# Patient Record
Sex: Female | Born: 1970 | Race: White | Hispanic: No | Marital: Married | Smoking: Never smoker
Health system: Southern US, Community
[De-identification: ages and names within clinical notes are randomized; demographics above are authoritative.]

## PROBLEM LIST (undated history)

## (undated) DIAGNOSIS — K219 Gastro-esophageal reflux disease without esophagitis: Secondary | ICD-10-CM

## (undated) DIAGNOSIS — R519 Headache, unspecified: Secondary | ICD-10-CM

## (undated) DIAGNOSIS — N951 Menopausal and female climacteric states: Secondary | ICD-10-CM

## (undated) DIAGNOSIS — Z464 Encounter for fitting and adjustment of orthodontic device: Secondary | ICD-10-CM

## (undated) DIAGNOSIS — R002 Palpitations: Secondary | ICD-10-CM

## (undated) DIAGNOSIS — J45909 Unspecified asthma, uncomplicated: Secondary | ICD-10-CM

## (undated) DIAGNOSIS — IMO0001 Reserved for inherently not codable concepts without codable children: Secondary | ICD-10-CM

## (undated) DIAGNOSIS — Z973 Presence of spectacles and contact lenses: Secondary | ICD-10-CM

## (undated) HISTORY — PX: ABLATION: SHX5711

---

## 2004-02-21 ENCOUNTER — Ambulatory Visit: Payer: Self-pay | Admitting: Urology

## 2005-10-04 ENCOUNTER — Ambulatory Visit: Payer: Self-pay | Admitting: Internal Medicine

## 2005-11-14 ENCOUNTER — Ambulatory Visit: Payer: Self-pay

## 2008-01-31 ENCOUNTER — Ambulatory Visit: Payer: Self-pay | Admitting: Family Medicine

## 2010-08-09 ENCOUNTER — Ambulatory Visit: Payer: Self-pay | Admitting: Obstetrics and Gynecology

## 2010-11-23 ENCOUNTER — Ambulatory Visit: Payer: Self-pay | Admitting: Obstetrics and Gynecology

## 2012-02-27 ENCOUNTER — Ambulatory Visit: Payer: Self-pay | Admitting: Obstetrics & Gynecology

## 2013-05-06 ENCOUNTER — Ambulatory Visit: Payer: Self-pay | Admitting: Obstetrics & Gynecology

## 2013-05-27 ENCOUNTER — Ambulatory Visit: Payer: Self-pay | Admitting: Otolaryngology

## 2013-07-07 DIAGNOSIS — J329 Chronic sinusitis, unspecified: Secondary | ICD-10-CM | POA: Insufficient documentation

## 2013-07-07 DIAGNOSIS — M549 Dorsalgia, unspecified: Secondary | ICD-10-CM | POA: Insufficient documentation

## 2013-07-07 DIAGNOSIS — Z9109 Other allergy status, other than to drugs and biological substances: Secondary | ICD-10-CM | POA: Insufficient documentation

## 2013-07-07 DIAGNOSIS — Z8679 Personal history of other diseases of the circulatory system: Secondary | ICD-10-CM | POA: Insufficient documentation

## 2013-07-07 DIAGNOSIS — J45909 Unspecified asthma, uncomplicated: Secondary | ICD-10-CM | POA: Insufficient documentation

## 2013-07-07 DIAGNOSIS — R202 Paresthesia of skin: Secondary | ICD-10-CM | POA: Insufficient documentation

## 2013-07-07 DIAGNOSIS — R5383 Other fatigue: Secondary | ICD-10-CM | POA: Insufficient documentation

## 2014-06-07 ENCOUNTER — Ambulatory Visit: Payer: Self-pay | Admitting: Obstetrics & Gynecology

## 2015-11-14 ENCOUNTER — Other Ambulatory Visit: Payer: Self-pay | Admitting: Obstetrics & Gynecology

## 2015-11-14 DIAGNOSIS — Z1231 Encounter for screening mammogram for malignant neoplasm of breast: Secondary | ICD-10-CM

## 2015-11-25 ENCOUNTER — Ambulatory Visit
Admission: RE | Admit: 2015-11-25 | Discharge: 2015-11-25 | Disposition: A | Discharge status: Home / Self Care | Payer: BLUE CROSS/BLUE SHIELD | Source: Ambulatory Visit | Attending: Obstetrics & Gynecology | Admitting: Obstetrics & Gynecology

## 2015-11-25 ENCOUNTER — Other Ambulatory Visit: Payer: Self-pay | Admitting: Obstetrics & Gynecology

## 2015-11-25 DIAGNOSIS — Z1231 Encounter for screening mammogram for malignant neoplasm of breast: Secondary | ICD-10-CM

## 2016-08-03 ENCOUNTER — Other Ambulatory Visit: Payer: Self-pay | Admitting: Neurology

## 2016-08-03 DIAGNOSIS — H539 Unspecified visual disturbance: Secondary | ICD-10-CM

## 2016-08-03 DIAGNOSIS — R42 Dizziness and giddiness: Secondary | ICD-10-CM

## 2016-08-03 DIAGNOSIS — R413 Other amnesia: Secondary | ICD-10-CM

## 2016-08-17 ENCOUNTER — Ambulatory Visit
Admission: RE | Admit: 2016-08-17 | Discharge: 2016-08-17 | Disposition: A | Discharge status: Home / Self Care | Payer: BLUE CROSS/BLUE SHIELD | Source: Ambulatory Visit | Attending: Neurology | Admitting: Neurology

## 2016-08-17 DIAGNOSIS — R4182 Altered mental status, unspecified: Secondary | ICD-10-CM | POA: Diagnosis present

## 2016-08-17 DIAGNOSIS — H539 Unspecified visual disturbance: Secondary | ICD-10-CM

## 2016-08-17 DIAGNOSIS — G44099 Other trigeminal autonomic cephalgias (TAC), not intractable: Secondary | ICD-10-CM | POA: Insufficient documentation

## 2016-08-17 DIAGNOSIS — R413 Other amnesia: Secondary | ICD-10-CM | POA: Insufficient documentation

## 2016-08-17 DIAGNOSIS — R42 Dizziness and giddiness: Secondary | ICD-10-CM

## 2016-08-17 MED ORDER — GADOBENATE DIMEGLUMINE 529 MG/ML IV SOLN
12.0000 mL | Freq: Once | INTRAVENOUS | Status: AC | PRN
  Administered 2016-08-17: 12 mL via INTRAVENOUS

## 2016-09-25 DIAGNOSIS — J320 Chronic maxillary sinusitis: Secondary | ICD-10-CM | POA: Diagnosis not present

## 2016-09-25 DIAGNOSIS — J331 Polypoid sinus degeneration: Secondary | ICD-10-CM | POA: Diagnosis not present

## 2016-12-11 DIAGNOSIS — J320 Chronic maxillary sinusitis: Secondary | ICD-10-CM | POA: Diagnosis not present

## 2016-12-11 DIAGNOSIS — J331 Polypoid sinus degeneration: Secondary | ICD-10-CM | POA: Diagnosis not present

## 2017-01-21 DIAGNOSIS — R1084 Generalized abdominal pain: Secondary | ICD-10-CM | POA: Diagnosis not present

## 2017-01-21 DIAGNOSIS — R197 Diarrhea, unspecified: Secondary | ICD-10-CM | POA: Diagnosis not present

## 2017-01-22 ENCOUNTER — Other Ambulatory Visit: Payer: Self-pay | Admitting: Physician Assistant

## 2017-01-22 DIAGNOSIS — R1084 Generalized abdominal pain: Secondary | ICD-10-CM

## 2017-01-29 ENCOUNTER — Ambulatory Visit: Payer: BLUE CROSS/BLUE SHIELD

## 2017-06-28 ENCOUNTER — Ambulatory Visit (INDEPENDENT_AMBULATORY_CARE_PROVIDER_SITE_OTHER): Payer: BLUE CROSS/BLUE SHIELD | Admitting: Obstetrics & Gynecology

## 2017-06-28 ENCOUNTER — Encounter: Payer: Self-pay | Admitting: Obstetrics & Gynecology

## 2017-06-28 VITALS — BP 120/80 | HR 60 | Ht 65.0 in | Wt 145.0 lb

## 2017-06-28 DIAGNOSIS — Z1231 Encounter for screening mammogram for malignant neoplasm of breast: Secondary | ICD-10-CM

## 2017-06-28 DIAGNOSIS — Z0000 Encounter for general adult medical examination without abnormal findings: Secondary | ICD-10-CM

## 2017-06-28 DIAGNOSIS — Z13220 Encounter for screening for lipoid disorders: Secondary | ICD-10-CM

## 2017-06-28 DIAGNOSIS — Z1329 Encounter for screening for other suspected endocrine disorder: Secondary | ICD-10-CM | POA: Diagnosis not present

## 2017-06-28 DIAGNOSIS — Z131 Encounter for screening for diabetes mellitus: Secondary | ICD-10-CM

## 2017-06-28 DIAGNOSIS — Z1321 Encounter for screening for nutritional disorder: Secondary | ICD-10-CM | POA: Diagnosis not present

## 2017-06-28 DIAGNOSIS — N951 Menopausal and female climacteric states: Secondary | ICD-10-CM | POA: Insufficient documentation

## 2017-06-28 DIAGNOSIS — Z1239 Encounter for other screening for malignant neoplasm of breast: Secondary | ICD-10-CM

## 2017-06-28 DIAGNOSIS — Z124 Encounter for screening for malignant neoplasm of cervix: Secondary | ICD-10-CM

## 2017-06-28 MED ORDER — CLONIDINE 0.1 MG/24HR TD PTWK: 0.1000 mg | MEDICATED_PATCH | TRANSDERMAL | 11 refills | Status: DC

## 2017-06-28 NOTE — Progress Notes (Signed)
HPI:      [name] is a 47 y.o. [id number] who LMP was in the past, she presents today for her annual examination.  The patient has no complaints today other than worsening HOT FLASHES and NIGHT SWEATS over the past 1+ year. The patient is sexually active. Herlast pap: approximate date 2015 and was normal and last mammogram: approximate date 2017 and was normal.  The patient does perform self breast exams.  There is notable family history of breast or ovarian cancer in her family. The patient is not taking hormone replacement therapy. Patient denies post-menopausal vaginal bleeding.  Pt has had ablation. The patient has regular exercise: yes. The patient denies current symptoms of depression.    PMHx: History reviewed. No pertinent past medical history. Past Surgical History:  Procedure Laterality Date  . ABLATION     Family History  Problem Relation Age of Onset  . Lung cancer Mother   . Diabetes Mother   . Heart failure Mother   . CAD Mother   . Heart disease Father   . Breast cancer Maternal Aunt    Social History   Tobacco Use  . Smoking status: Never Smoker  . Smokeless tobacco: Never Used  Substance Use Topics  . Alcohol use: Yes  . Drug use: Not Currently    Current Outpatient Medications:  .  SUMAtriptan (IMITREX) 100 MG tablet, Take by mouth., Disp: , Rfl:  .  cloNIDine (CATAPRES - DOSED IN MG/24 HR) 0.1 mg/24hr patch, Place 1 patch (0.1 mg total) onto the skin once a week., Disp: 4 patch, Rfl: 11 Allergies: Patient has no known allergies.  Review of Systems  Constitutional: Negative for chills, fever and malaise/fatigue.  HENT: Negative for congestion, sinus pain and sore throat.   Eyes: Negative for blurred vision and pain.  Respiratory: Negative for cough and wheezing.   Cardiovascular: Negative for chest pain and leg swelling.  Gastrointestinal: Negative for abdominal pain, constipation, diarrhea, heartburn, nausea and vomiting.  Genitourinary: Negative  for dysuria, frequency, hematuria and urgency.  Musculoskeletal: Negative for back pain, joint pain, myalgias and neck pain.  Skin: Negative for itching and rash.  Neurological: Negative for dizziness, tremors and weakness.  Endo/Heme/Allergies: Does not bruise/bleed easily.  Psychiatric/Behavioral: Negative for depression. The patient is not nervous/anxious and does not have insomnia.     Objective: BP 120/80   Pulse 60   Ht 5\' 5"  (1.651 m)   Wt 145 lb (65.8 kg)   BMI 24.13 kg/m   Filed Weights   06/28/17 1538  Weight: 145 lb (65.8 kg)   Body mass index is 24.13 kg/m. Physical Exam  Constitutional: She is oriented to person, place, and time. She appears well-developed and well-nourished. No distress.  Genitourinary: Rectum normal, vagina normal and uterus normal. Pelvic exam was performed with patient supine. There is no rash or lesion on the right labia. There is no rash or lesion on the left labia. Vagina exhibits no lesion. No bleeding in the vagina. Right adnexum does not display mass and does not display tenderness. Left adnexum does not display mass and does not display tenderness. Cervix does not exhibit motion tenderness, lesion, friability or polyp.   Uterus is mobile and midaxial. Uterus is not enlarged or exhibiting a mass.  HENT:  Head: Normocephalic and atraumatic. Head is without laceration.  Right Ear: Hearing normal.  Left Ear: Hearing normal.  Nose: No epistaxis.  No foreign bodies.  Mouth/Throat: Uvula is midline, oropharynx is clear  and moist and mucous membranes are normal.  Eyes: Pupils are equal, round, and reactive to light.  Neck: Normal range of motion. Neck supple. No thyromegaly present.  Cardiovascular: Normal rate and regular rhythm. Exam reveals no gallop and no friction rub.  No murmur heard. Pulmonary/Chest: Effort normal and breath sounds normal. No respiratory distress. She has no wheezes. Right breast exhibits no mass, no skin change and no  tenderness. Left breast exhibits no mass, no skin change and no tenderness.  Abdominal: Soft. Bowel sounds are normal. She exhibits no distension. There is no tenderness. There is no rebound.  Musculoskeletal: Normal range of motion.  Neurological: She is alert and oriented to person, place, and time. No cranial nerve deficit.  Skin: Skin is warm and dry.  Psychiatric: She has a normal mood and affect. Judgment normal.  Vitals reviewed.   Assessment: Annual Exam 1. Annual physical exam   2. Screening for breast cancer   3. Encounter for vitamin deficiency screening   4. Screening for thyroid disorder   5. Screening for cervical cancer   6. Screening for diabetes mellitus   7. Screening for cholesterol level   8. Hot flash, menopausal     Plan:            1.  Cervical Screening-  Pap smear done today  2. Breast screening- Exam annually and mammogram scheduled  3. Colonoscopy every 10 years, Hemoccult testing after age 49  4. Labs To return fasting at a later date  5. Counseling for hormonal therapy: none Will start Clonidine patch for hot flash sx's and monitor progress Info provided.  Alternatives discussed.    F/U  Return in about 1 year (around 06/29/2018) for Annual.  Kirsten MajorPaul Jasher Barkan, MD, Merlinda FrederickFACOG [facility], [facility] 06/28/2017  4:08 PM

## 2017-06-28 NOTE — Patient Instructions (Addendum)
PAP every three years Mammogram every year    Call [phone] to schedule at [facility] Colonoscopy every 10 years after age 49 Labs soon  Clonidine skin patches What is this medicine? CLONIDINE (KLOE ni deen) is used to treat high blood pressure. This medicine may be used for other purposes; ask your health care provider or pharmacist if you have questions. COMMON BRAND NAME(S): Catapres-TTS What should I tell my health care provider before I take this medicine? They need to know if you have any of these conditions: -kidney disease -an unusual or allergic reaction to clonidine, other medicines, foods, dyes, or preservatives -pregnant or trying to get pregnant -breast-feeding How should I use this medicine? This medicine is for external use only. Follow the directions on the prescription label. Apply the patch to an area of the upper arm or part of the body that is clean, dry and hairless. Avoid injured, irritated, calloused, or scarred areas. Use a different site each time to prevent skin irritation. Do not cut or trim the patch. One patch should last for 7 days. Do not use your medicine more often than directed. Do not stop using except on the advice of your doctor or health care professional. You must gradually reduce the dose or you may get a dangerous increase in blood pressure. Talk to your pediatrician regarding the use of this medicine in children. Special care may be needed. Overdosage: If you think you have taken too much of this medicine contact a poison control center or emergency room at once. NOTE: This medicine is only for you. Do not share this medicine with others. What if I miss a dose? Replace each patch on the same day of each week, or if the patch falls off. If you do forget to change the patch for two or three days, check with your doctor or health care professional. What may interact with this medicine? Do not take this medicine with any of the following  medications: -MAOIs like Carbex, Eldepryl, Marplan, Nardil, and Parnate This medicine may also interact with the following medications: -barbiturate medicines for inducing sleep or treating seizures like phenobarbital -certain medicines for blood pressure, heart disease, irregular heart beat -certain medicines for depression, anxiety, or psychotic disturbances -prescription pain medicines This list may not describe all possible interactions. Give your health care provider a list of all the medicines, herbs, non-prescription drugs, or dietary supplements you use. Also tell them if you smoke, drink alcohol, or use illegal drugs. Some items may interact with your medicine. What should I watch for while using this medicine? Visit your doctor or health care professional for regular checks on your progress. Check your heart rate and blood pressure regularly while you are using this medicine. Ask your doctor or health care professional what your heart rate should be and when you should contact him or her. You can shower or bathe with the skin patch in position. If the patch gets loose, cover it with the extra adhesive overlay provided. You may get drowsy or dizzy. Do not drive, use machinery, or do anything that needs mental alertness until you know how this medicine affects you. To avoid dizzy or fainting spells, do not stand or sit up quickly, especially if you are an older person. Alcohol can make you more drowsy and dizzy. Avoid alcoholic drinks. Your mouth may get dry. Chewing sugarless gum or sucking hard candy, and drinking plenty of water will help. Do not treat yourself for coughs, colds, or pain while  you are using this medicine without asking your doctor or health care professional for advice. Some ingredients may increase your blood pressure. If you are going to have surgery tell your doctor or health care professional that you are using this medicine. If you are going to have a magnetic  resonance imaging (MRI) procedure, tell your MRI technician if you have this patch on your body. It must be removed before a MRI. What side effects may I notice from receiving this medicine? Side effects that you should report to your doctor or health care professional as soon as possible: -allergic reactions like skin rash, itching or hives, swelling of the face, lips, or tongue -anxiety, nervousness -chest pain -depression -fast, irregular heartbeat -swelling of feet or legs -unusually weak or tired Side effects that usually do not require medical attention (report to your doctor or health care professional if they continue or are bothersome): -change in sex drive or performance -constipation -headache -skin redness, irritation, or darkening under the patch area This list may not describe all possible side effects. Call your doctor for medical advice about side effects. You may report side effects to FDA at 1-800-FDA-1088. Where should I keep my medicine? Keep out of the reach of children. Store at room temperature between 15 and 30 degrees C (59 to 86 degrees F). Throw away any unused medicine after the expiration date. NOTE: This sheet is a summary. It may not cover all possible information. If you have questions about this medicine, talk to your doctor, pharmacist, or health care provider.  2018 Elsevier/Gold Standard (2014-11-22 16:10:[id number]:28)

## 2017-07-03 LAB — IGP, APTIMA HPV
HPV APTIMA: NEGATIVE
PAP SMEAR COMMENT: 0

## 2017-07-09 ENCOUNTER — Other Ambulatory Visit: Payer: BLUE CROSS/BLUE SHIELD

## 2017-07-09 DIAGNOSIS — Z1321 Encounter for screening for nutritional disorder: Secondary | ICD-10-CM

## 2017-07-09 DIAGNOSIS — Z13220 Encounter for screening for lipoid disorders: Secondary | ICD-10-CM

## 2017-07-09 DIAGNOSIS — Z131 Encounter for screening for diabetes mellitus: Secondary | ICD-10-CM

## 2017-07-09 DIAGNOSIS — Z1329 Encounter for screening for other suspected endocrine disorder: Secondary | ICD-10-CM

## 2017-07-10 LAB — LIPID PANEL
CHOLESTEROL TOTAL: 151 mg/dL (ref 100–199)
Chol/HDL Ratio: 3.2 ratio (ref 0.0–4.4)
HDL: 47 mg/dL (ref >39)
LDL CALC: 80 mg/dL (ref 0–99)
Triglycerides: 119 mg/dL (ref 0–149)
VLDL Cholesterol Cal: 24 mg/dL (ref 5–40)

## 2017-07-10 LAB — GLUCOSE, FASTING: Glucose, Plasma: 54 mg/dL — ABNORMAL LOW (ref 65–99)

## 2017-07-10 LAB — TSH: TSH: 2.96 u[IU]/mL (ref 0.450–4.500)

## 2017-07-10 LAB — VITAMIN D 25 HYDROXY (VIT D DEFICIENCY, FRACTURES): Vit D, 25-Hydroxy: 32.7 ng/mL (ref 30.0–100.0)

## 2017-07-25 ENCOUNTER — Ambulatory Visit
Admission: RE | Admit: 2017-07-25 | Discharge: 2017-07-25 | Disposition: A | Discharge status: Home / Self Care | Payer: BLUE CROSS/BLUE SHIELD | Source: Ambulatory Visit | Attending: Obstetrics & Gynecology | Admitting: Obstetrics & Gynecology

## 2017-07-25 DIAGNOSIS — Z1239 Encounter for other screening for malignant neoplasm of breast: Secondary | ICD-10-CM

## 2017-07-25 DIAGNOSIS — Z1231 Encounter for screening mammogram for malignant neoplasm of breast: Secondary | ICD-10-CM | POA: Diagnosis present

## 2017-09-11 DIAGNOSIS — R6882 Decreased libido: Secondary | ICD-10-CM | POA: Diagnosis not present

## 2017-09-11 DIAGNOSIS — N951 Menopausal and female climacteric states: Secondary | ICD-10-CM | POA: Diagnosis not present

## 2017-09-11 DIAGNOSIS — R232 Flushing: Secondary | ICD-10-CM | POA: Diagnosis not present

## 2017-09-19 DIAGNOSIS — G479 Sleep disorder, unspecified: Secondary | ICD-10-CM | POA: Diagnosis not present

## 2017-09-19 DIAGNOSIS — N951 Menopausal and female climacteric states: Secondary | ICD-10-CM | POA: Diagnosis not present

## 2017-09-19 DIAGNOSIS — R232 Flushing: Secondary | ICD-10-CM | POA: Diagnosis not present

## 2017-12-13 DIAGNOSIS — R51 Headache: Secondary | ICD-10-CM | POA: Diagnosis not present

## 2017-12-13 DIAGNOSIS — J4520 Mild intermittent asthma, uncomplicated: Secondary | ICD-10-CM | POA: Diagnosis not present

## 2017-12-13 DIAGNOSIS — Z79899 Other long term (current) drug therapy: Secondary | ICD-10-CM | POA: Diagnosis not present

## 2017-12-13 DIAGNOSIS — M2550 Pain in unspecified joint: Secondary | ICD-10-CM | POA: Diagnosis not present

## 2018-02-24 DIAGNOSIS — L820 Inflamed seborrheic keratosis: Secondary | ICD-10-CM | POA: Diagnosis not present

## 2018-02-24 DIAGNOSIS — L989 Disorder of the skin and subcutaneous tissue, unspecified: Secondary | ICD-10-CM | POA: Diagnosis not present

## 2018-02-24 DIAGNOSIS — L859 Epidermal thickening, unspecified: Secondary | ICD-10-CM | POA: Diagnosis not present

## 2018-02-24 DIAGNOSIS — L578 Other skin changes due to chronic exposure to nonionizing radiation: Secondary | ICD-10-CM | POA: Diagnosis not present

## 2018-07-14 DIAGNOSIS — J320 Chronic maxillary sinusitis: Secondary | ICD-10-CM | POA: Diagnosis not present

## 2018-10-17 ENCOUNTER — Other Ambulatory Visit: Payer: Self-pay | Admitting: Internal Medicine

## 2018-10-17 DIAGNOSIS — Z1231 Encounter for screening mammogram for malignant neoplasm of breast: Secondary | ICD-10-CM

## 2018-12-10 ENCOUNTER — Ambulatory Visit
Admission: RE | Admit: 2018-12-10 | Discharge: 2018-12-10 | Disposition: A | Discharge status: Home / Self Care | Payer: BLUE CROSS/BLUE SHIELD | Source: Ambulatory Visit | Attending: Internal Medicine | Admitting: Internal Medicine

## 2018-12-10 ENCOUNTER — Other Ambulatory Visit: Payer: Self-pay

## 2018-12-10 DIAGNOSIS — Z1231 Encounter for screening mammogram for malignant neoplasm of breast: Secondary | ICD-10-CM | POA: Insufficient documentation

## 2019-10-06 DIAGNOSIS — J320 Chronic maxillary sinusitis: Secondary | ICD-10-CM | POA: Insufficient documentation

## 2019-11-13 HISTORY — PX: SINUS SURGERY WITH INSTATRAK: SHX5215

## 2019-11-24 ENCOUNTER — Other Ambulatory Visit: Payer: Self-pay | Admitting: Internal Medicine

## 2019-11-24 DIAGNOSIS — Z1231 Encounter for screening mammogram for malignant neoplasm of breast: Secondary | ICD-10-CM

## 2019-12-02 ENCOUNTER — Other Ambulatory Visit: Payer: Self-pay

## 2019-12-02 ENCOUNTER — Telehealth (INDEPENDENT_AMBULATORY_CARE_PROVIDER_SITE_OTHER): Payer: Self-pay | Admitting: Gastroenterology

## 2019-12-02 DIAGNOSIS — Z1211 Encounter for screening for malignant neoplasm of colon: Secondary | ICD-10-CM

## 2019-12-02 NOTE — Progress Notes (Signed)
Gastroenterology Pre-Procedure Review  Request Date: Friday 01/29/20 Requesting Physician: Dr. Servando Snare  PATIENT REVIEW QUESTIONS: The patient responded to the following health history questions as indicated:    1. Are you having any GI issues? no 2. Do you have a personal history of Polyps? no 3. Do you have a family history of Colon Cancer or Polyps? no 4. Diabetes Mellitus? no 5. Joint replacements in the past 12 months?no 6. Major health problems in the past 3 months?sinus surgery  3 wks ago at [facility] with Dr. Cindie Laroche 7. Any artificial heart valves, MVP, or defibrillator?no    MEDICATIONS & ALLERGIES:    Patient reports the following regarding taking any anticoagulation/antiplatelet therapy:   Plavix, Coumadin, Eliquis, Xarelto, Lovenox, Pradaxa, Brilinta, or Effient? no Aspirin? no  Patient confirms/reports the following medications:  Current Outpatient Medications  Medication Sig Dispense Refill  . traZODone (DESYREL) 25 mg TABS tablet Take 25 mg by mouth at bedtime.     No current facility-administered medications for this visit.    Patient confirms/reports the following allergies:  No Known Allergies  Orders Placed This Encounter  Procedures  . Procedural/ Surgical Case Request: COLONOSCOPY WITH PROPOFOL    Standing Status:   Standing    Number of Occurrences:   1    Order Specific Question:   Pre-op diagnosis    Answer:   screening colonoscopy    Order Specific Question:   CPT Code    Answer:   04540    AUTHORIZATION INFORMATION Primary Insurance: 1D#: Group #:  Secondary Insurance: 1D#: Group #:  SCHEDULE INFORMATION: Date: 01/29/20 Time: Location:[facility]

## 2020-01-25 ENCOUNTER — Other Ambulatory Visit: Payer: Self-pay

## 2020-01-25 ENCOUNTER — Encounter: Payer: Self-pay | Admitting: Gastroenterology

## 2020-01-27 ENCOUNTER — Other Ambulatory Visit: Payer: Self-pay

## 2020-01-27 ENCOUNTER — Other Ambulatory Visit
Admission: RE | Admit: 2020-01-27 | Discharge: 2020-01-27 | Disposition: A | Discharge status: Home / Self Care | Payer: BLUE CROSS/BLUE SHIELD | Source: Ambulatory Visit | Attending: Gastroenterology | Admitting: Gastroenterology

## 2020-01-27 DIAGNOSIS — Z01812 Encounter for preprocedural laboratory examination: Secondary | ICD-10-CM | POA: Insufficient documentation

## 2020-01-27 DIAGNOSIS — Z20822 Contact with and (suspected) exposure to covid-19: Secondary | ICD-10-CM | POA: Insufficient documentation

## 2020-01-27 LAB — SARS CORONAVIRUS 2 (TAT 6-24 HRS): SARS Coronavirus 2: NEGATIVE

## 2020-01-28 NOTE — Discharge Instructions (Signed)
General Anesthesia, Adult, Care After This sheet gives you information about how to care for yourself after your procedure. Your health care provider may also give you more specific instructions. If you have problems or questions, contact your health care provider. What can I expect after the procedure? After the procedure, the following side effects are common:  Pain or discomfort at the IV site.  Nausea.  Vomiting.  Sore throat.  Trouble concentrating.  Feeling cold or chills.  Weak or tired.  Sleepiness and fatigue.  Soreness and body aches. These side effects can affect parts of the body that were not involved in surgery. Follow these instructions at home:  For at least 24 hours after the procedure:  Have a responsible adult stay with you. It is important to have someone help care for you until you are awake and alert.  Rest as needed.  Do not: ? Participate in activities in which you could fall or become injured. ? Drive. ? Use heavy machinery. ? Drink alcohol. ? Take sleeping pills or medicines that cause drowsiness. ? Make important decisions or sign legal documents. ? Take care of children on your own. Eating and drinking  Follow any instructions from your health care provider about eating or drinking restrictions.  When you feel hungry, start by eating small amounts of foods that are soft and easy to digest (bland), such as toast. Gradually return to your regular diet.  Drink enough fluid to keep your urine pale yellow.  If you vomit, rehydrate by drinking water, juice, or clear broth. General instructions  If you have sleep apnea, surgery and certain medicines can increase your risk for breathing problems. Follow instructions from your health care provider about wearing your sleep device: ? Anytime you are sleeping, including during daytime naps. ? While taking prescription pain medicines, sleeping medicines, or medicines that make you drowsy.  Return to  your normal activities as told by your health care provider. Ask your health care provider what activities are safe for you.  Take over-the-counter and prescription medicines only as told by your health care provider.  If you smoke, do not smoke without supervision.  Keep all follow-up visits as told by your health care provider. This is important. Contact a health care provider if:  You have nausea or vomiting that does not get better with medicine.  You cannot eat or drink without vomiting.  You have pain that does not get better with medicine.  You are unable to pass urine.  You develop a skin rash.  You have a fever.  You have redness around your IV site that gets worse. Get help right away if:  You have difficulty breathing.  You have chest pain.  You have blood in your urine or stool, or you vomit blood. Summary  After the procedure, it is common to have a sore throat or nausea. It is also common to feel tired.  Have a responsible adult stay with you for the first 24 hours after general anesthesia. It is important to have someone help care for you until you are awake and alert.  When you feel hungry, start by eating small amounts of foods that are soft and easy to digest (bland), such as toast. Gradually return to your regular diet.  Drink enough fluid to keep your urine pale yellow.  Return to your normal activities as told by your health care provider. Ask your health care provider what activities are safe for you. This information is not   intended to replace advice given to you by your health care provider. Make sure you discuss any questions you have with your health care provider. Document Revised: 03/15/2017 Document Reviewed: 10/26/2016 Elsevier Patient Education  2020 Elsevier Inc.  

## 2020-01-29 ENCOUNTER — Ambulatory Visit: Payer: BLUE CROSS/BLUE SHIELD | Admitting: Anesthesiology

## 2020-01-29 ENCOUNTER — Other Ambulatory Visit: Payer: Self-pay

## 2020-01-29 ENCOUNTER — Ambulatory Visit
Admission: RE | Admit: 2020-01-29 | Discharge: 2020-01-29 | Disposition: A | Discharge status: Home / Self Care | Payer: BLUE CROSS/BLUE SHIELD | Attending: Gastroenterology | Admitting: Gastroenterology

## 2020-01-29 ENCOUNTER — Encounter: Payer: Self-pay | Admitting: Gastroenterology

## 2020-01-29 ENCOUNTER — Encounter
Admission: RE | Disposition: A | Discharge status: Home / Self Care | Payer: Self-pay | Source: Home / Self Care | Attending: Gastroenterology

## 2020-01-29 DIAGNOSIS — K5730 Diverticulosis of large intestine without perforation or abscess without bleeding: Secondary | ICD-10-CM | POA: Diagnosis not present

## 2020-01-29 DIAGNOSIS — Z1210 Encounter for screening for malignant neoplasm of intestinal tract, unspecified: Secondary | ICD-10-CM | POA: Diagnosis not present

## 2020-01-29 DIAGNOSIS — K640 First degree hemorrhoids: Secondary | ICD-10-CM | POA: Diagnosis not present

## 2020-01-29 DIAGNOSIS — Z1211 Encounter for screening for malignant neoplasm of colon: Secondary | ICD-10-CM | POA: Insufficient documentation

## 2020-01-29 HISTORY — PX: COLONOSCOPY WITH PROPOFOL: SHX5780

## 2020-01-29 HISTORY — DX: Menopausal and female climacteric states: N95.1

## 2020-01-29 HISTORY — DX: Headache, unspecified: R51.9

## 2020-01-29 HISTORY — DX: Gastro-esophageal reflux disease without esophagitis: K21.9

## 2020-01-29 HISTORY — DX: Unspecified asthma, uncomplicated: J45.909

## 2020-01-29 SURGERY — COLONOSCOPY WITH PROPOFOL
Anesthesia: General

## 2020-01-29 MED ORDER — PROPOFOL 10 MG/ML IV BOLUS
INTRAVENOUS | Status: DC | PRN
  Administered 2020-01-29: 100 mg via INTRAVENOUS
  Administered 2020-01-29: 50 mg via INTRAVENOUS

## 2020-01-29 MED ORDER — LACTATED RINGERS IV SOLN: INTRAVENOUS | Status: DC

## 2020-01-29 MED ORDER — LIDOCAINE HCL (CARDIAC) PF 100 MG/5ML IV SOSY
PREFILLED_SYRINGE | INTRAVENOUS | Status: DC | PRN
  Administered 2020-01-29: 50 mg via INTRAVENOUS

## 2020-01-29 MED ORDER — ACETAMINOPHEN 325 MG PO TABS: 325.0000 mg | ORAL_TABLET | ORAL | Status: DC | PRN

## 2020-01-29 MED ORDER — STERILE WATER FOR IRRIGATION IR SOLN
Status: DC | PRN
  Administered 2020-01-29: 150 mL

## 2020-01-29 MED ORDER — ACETAMINOPHEN 160 MG/5ML PO SOLN: 325.0000 mg | ORAL | Status: DC | PRN

## 2020-01-29 SURGICAL SUPPLY — 16 items
FCP ESCP3.2XJMB 240X2.8X (MISCELLANEOUS)
FORCEPS BIOP RAD 4 LRG CAP 4 (CUTTING FORCEPS) IMPLANT
FORCEPS BIOP RJ4 240 W/NDL (MISCELLANEOUS)
FORCEPS ESCP3.2XJMB 240X2.8X (MISCELLANEOUS) IMPLANT
GOWN CVR UNV OPN BCK APRN NK (MISCELLANEOUS) ×2 IMPLANT
GOWN ISOL THUMB LOOP REG UNIV (MISCELLANEOUS) ×6
INJECTOR VARIJECT VIN23 (MISCELLANEOUS) IMPLANT
KIT DEFENDO VALVE AND CONN (KITS) IMPLANT
KIT PRC NS LF DISP ENDO (KITS) ×1 IMPLANT
KIT PROCEDURE OLYMPUS (KITS) ×3
MANIFOLD NEPTUNE II (INSTRUMENTS) ×3 IMPLANT
MARKER SPOT ENDO TATTOO 5ML (MISCELLANEOUS) IMPLANT
PROBE APC STR FIRE (PROBE) IMPLANT
SPOT EX ENDOSCOPIC TATTOO (MISCELLANEOUS)
VARIJECT INJECTOR VIN23 (MISCELLANEOUS)
WATER STERILE IRR 250ML POUR (IV SOLUTION) ×3 IMPLANT

## 2020-01-29 NOTE — H&P (Signed)
Midge Minium, MD [facility] [address] [place], [place] [address] Phone: [phone] Fax : [phone]  Primary Care Physician:  Marguarite Arbour, MD Primary Gastroenterologist:  Dr. Servando Snare  Pre-Procedure History & Physical: HPI:  Kirsten Patterson is a 49 y.o. female is here for a screening colonoscopy.   Past Medical History:  Diagnosis Date  . Asthma    no flare up in years   . GERD (gastroesophageal reflux disease)   . Headache    migraines/ every few weeks  . Hot flashes due to menopause     Past Surgical History:  Procedure Laterality Date  . ABLATION    . SINUS SURGERY WITH INSTATRAK  11/13/2019   x 6    Prior to Admission medications   Medication Sig Start Date End Date Taking? Authorizing Provider  fluticasone (FLONASE) 50 MCG/ACT nasal spray Place into both nostrils daily.   Yes [provider]  traZODone (DESYREL) 25 mg TABS tablet Take 25 mg by mouth at bedtime as needed.     [provider]    Allergies as of 12/02/2019  . (No Known Allergies)    Family History  Problem Relation Age of Onset  . Lung cancer Mother   . Diabetes Mother   . Heart failure Mother   . CAD Mother   . Heart disease Father   . Breast cancer Maternal Aunt     Social History   Socioeconomic History  . Marital status: Married    Spouse name: Not on file  . Number of children: Not on file  . Years of education: Not on file  . Highest education level: Not on file  Occupational History  . Not on file  Tobacco Use  . Smoking status: Never Smoker  . Smokeless tobacco: Never Used  Vaping Use  . Vaping Use: Never used  Substance and Sexual Activity  . Alcohol use: Yes    Alcohol/week: 7.0 standard drinks    Types: 7 Cans of beer per week  . Drug use: Not Currently  . Sexual activity: Yes  Other Topics Concern  . Not on file  Social History Narrative  . Not on file   Social Determinants of Health   Financial Resource Strain:   . Difficulty  of Paying Living Expenses: Not on file  Food Insecurity:   . Worried About Programme researcher, broadcasting/film/video in the Last Year: Not on file  . Ran Out of Food in the Last Year: Not on file  Transportation Needs:   . Lack of Transportation (Medical): Not on file  . Lack of Transportation (Non-Medical): Not on file  Physical Activity:   . Days of Exercise per Week: Not on file  . Minutes of Exercise per Session: Not on file  Stress:   . Feeling of Stress : Not on file  Social Connections:   . Frequency of Communication with Friends and Family: Not on file  . Frequency of Social Gatherings with Friends and Family: Not on file  . Attends Religious Services: Not on file  . Active Member of Clubs or Organizations: Not on file  . Attends Banker Meetings: Not on file  . Marital Status: Not on file  Intimate Partner Violence:   . Fear of Current or Ex-Partner: Not on file  . Emotionally Abused: Not on file  . Physically Abused: Not on file  . Sexually Abused: Not on file    Review of Systems: See HPI, otherwise negative  ROS  Physical Exam: BP (!) 141/87   Pulse 81   Temp (!) 97.5 F (36.4 C) (Temporal)   Ht 5' 4.5" (1.638 m)   Wt 66.7 kg   SpO2 99%   BMI 24.84 kg/m  General:   Alert,  pleasant and cooperative in NAD Head:  Normocephalic and atraumatic. Neck:  Supple; no masses or thyromegaly. Lungs:  Clear throughout to auscultation.    Heart:  Regular rate and rhythm. Abdomen:  Soft, nontender and nondistended. Normal bowel sounds, without guarding, and without rebound.   Neurologic:  Alert and  oriented x4;  grossly normal neurologically.  Impression/Plan: Kirsten Patterson is now here to undergo a screening colonoscopy.  Risks, benefits, and alternatives regarding colonoscopy have been reviewed with the patient.  Questions have been answered.  All parties agreeable.

## 2020-01-29 NOTE — Transfer of Care (Signed)
Immediate Anesthesia Transfer of Care Note  Patient: Kirsten Patterson  Procedure(s) Performed: COLONOSCOPY WITH PROPOFOL (N/A )  Patient Location: PACU  Anesthesia Type: General  Level of Consciousness: awake, alert  and patient cooperative  Airway and Oxygen Therapy: Patient Spontanous Breathing and Patient connected to supplemental oxygen  Post-op Assessment: Post-op Vital signs reviewed, Patient's Cardiovascular Status Stable, Respiratory Function Stable, Patent Airway and No signs of Nausea or vomiting  Post-op Vital Signs: Reviewed and stable  Complications: No complications documented.

## 2020-01-29 NOTE — Op Note (Addendum)
[facility]astroenterology Patient Name: Kirsten Patterson Procedure Date: 01/29/2020 8:51 AM MRN: [id number] Account #: [id number] Date of Birth: [birth year] Admit Type: Outpatient Age: 49 Room: [facility] OR ROOM 01 Gender: Female Note Status: Supervisor Override Procedure:             Colonoscopy Indications:           Screening for colorectal malignant neoplasm Providers:             Midge Minium MD, MD Referring MD:          Duane Lope. Judithann Sheen, MD (Referring MD) Medicines:             Propofol per Anesthesia Complications:         No immediate complications. Procedure:             Pre-Anesthesia Assessment:                        - Prior to the procedure, a History and Physical was                         performed, and patient medications and allergies were                         reviewed. The patient's tolerance of previous                         anesthesia was also reviewed. The risks and benefits                         of the procedure and the sedation options and risks                         were discussed with the patient. All questions were                         answered, and informed consent was obtained. Prior                         Anticoagulants: The patient has taken no previous                         anticoagulant or antiplatelet agents. ASA Grade                         Assessment: II - A patient with mild systemic disease.                         After reviewing the risks and benefits, the patient                         was deemed in satisfactory condition to undergo the                         procedure.                        After obtaining informed consent, the colonoscope was  passed under direct vision. Throughout the procedure,                         the patient's blood pressure, pulse, and oxygen                         saturations were monitored continuously. The was                         introduced through the  anus and advanced to the the                         cecum, identified by appendiceal orifice and ileocecal                         valve. The colonoscopy was performed without                         difficulty. The patient tolerated the procedure well.                         The quality of the bowel preparation was excellent. Findings:      The perianal and digital rectal examinations were normal.      Multiple small-mouthed diverticula were found in the sigmoid colon.      Non-bleeding internal hemorrhoids were found during retroflexion. The       hemorrhoids were Grade I (internal hemorrhoids that do not prolapse). Impression:            - Diverticulosis in the sigmoid colon.                        - Non-bleeding internal hemorrhoids.                        - No specimens collected. Recommendation:        - Discharge patient to home.                        - Resume previous diet.                        - Continue present medications.                        - Repeat colonoscopy in 10 years for screening                         purposes.                        - unless any change in family history or lower GI                         problems. Procedure Code(s):     --- Professional ---                        [phone], Colonoscopy, flexible; diagnostic, including                         collection of specimen(s)  by brushing or washing, when                         performed (separate procedure) Diagnosis Code(s):     --- Professional ---                        Z12.11, Encounter for screening for malignant neoplasm                         of colon CPT copyright 2019 American Medical Association. All rights reserved. The codes documented in this report are preliminary and upon coder review may  be revised to meet current compliance requirements. Midge Minium MD, MD 01/29/2020 9:16:10 AM This report has been signed electronically. Number of Addenda: 0 Note Initiated On: 01/29/2020 8:51  AM Scope Withdrawal Time: 0 hours 8 minutes 47 seconds  Total Procedure Duration: 0 hours 13 minutes 18 seconds  Estimated Blood Loss:  Estimated blood loss: none.      [facility]

## 2020-01-29 NOTE — Anesthesia Preprocedure Evaluation (Signed)
Anesthesia Evaluation  Patient identified by MRN, date of birth, ID band Patient awake    Reviewed: Allergy & Precautions, H&P , NPO status , Patient's Chart, lab work & pertinent test results, reviewed documented beta blocker date and time   Airway Mallampati: II  TM Distance: >3 FB Neck ROM: full    Dental no notable dental hx.    Pulmonary neg pulmonary ROS,    Pulmonary exam normal breath sounds clear to auscultation       Cardiovascular Exercise Tolerance: Good negative cardio ROS   Rhythm:regular Rate:Normal     Neuro/Psych negative neurological ROS  negative psych ROS   GI/Hepatic Neg liver ROS, GERD  Controlled,  Endo/Other  negative endocrine ROS  Renal/GU negative Renal ROS  negative genitourinary   Musculoskeletal   Abdominal   Peds  Hematology negative hematology ROS (+)   Anesthesia Other Findings   Reproductive/Obstetrics negative OB ROS                             Anesthesia Physical Anesthesia Plan  ASA: II  Anesthesia Plan: General   Post-op Pain Management:    Induction:   PONV Risk Score and Plan:   Airway Management Planned:   Additional Equipment:   Intra-op Plan:   Post-operative Plan:   Informed Consent: I have reviewed the patients History and Physical, chart, labs and discussed the procedure including the risks, benefits and alternatives for the proposed anesthesia with the patient or authorized representative who has indicated his/her understanding and acceptance.     Dental Advisory Given  Plan Discussed with: CRNA  Anesthesia Plan Comments:         Anesthesia Quick Evaluation

## 2020-01-29 NOTE — Anesthesia Procedure Notes (Signed)
Procedure Name: General with mask airway Date/Time: 01/29/2020 9:02 AM Performed by: Jinny Blossom, CRNA Pre-anesthesia Checklist: Timeout performed, Patient being monitored, Suction available, Emergency Drugs available and Patient identified Patient Re-evaluated:Patient Re-evaluated prior to induction Oxygen Delivery Method: Nasal cannula

## 2020-01-29 NOTE — Anesthesia Postprocedure Evaluation (Signed)
Anesthesia Post Note  Patient: Kirsten Patterson  Procedure(s) Performed: COLONOSCOPY WITH PROPOFOL (N/A )     Patient location during evaluation: PACU Anesthesia Type: General Level of consciousness: awake and alert Pain management: pain level controlled Vital Signs Assessment: post-procedure vital signs reviewed and stable Respiratory status: spontaneous breathing, nonlabored ventilation, respiratory function stable and patient connected to nasal cannula oxygen Cardiovascular status: blood pressure returned to baseline and stable Postop Assessment: no apparent nausea or vomiting Anesthetic complications: no   No complications documented.  [place]

## 2020-04-13 ENCOUNTER — Ambulatory Visit
Admission: RE | Admit: 2020-04-13 | Discharge: 2020-04-13 | Disposition: A | Discharge status: Home / Self Care | Payer: BC Managed Care – PPO | Source: Ambulatory Visit | Attending: Internal Medicine | Admitting: Internal Medicine

## 2020-04-13 ENCOUNTER — Other Ambulatory Visit: Payer: Self-pay

## 2020-04-13 DIAGNOSIS — Z1231 Encounter for screening mammogram for malignant neoplasm of breast: Secondary | ICD-10-CM | POA: Insufficient documentation

## 2020-05-04 DIAGNOSIS — H6981 Other specified disorders of Eustachian tube, right ear: Secondary | ICD-10-CM | POA: Diagnosis not present

## 2020-05-04 DIAGNOSIS — J320 Chronic maxillary sinusitis: Secondary | ICD-10-CM | POA: Diagnosis not present

## 2020-05-04 DIAGNOSIS — M26601 Right temporomandibular joint disorder, unspecified: Secondary | ICD-10-CM | POA: Diagnosis not present

## 2020-05-04 DIAGNOSIS — H9209 Otalgia, unspecified ear: Secondary | ICD-10-CM | POA: Diagnosis not present

## 2020-05-16 ENCOUNTER — Other Ambulatory Visit: Payer: Self-pay

## 2020-05-16 ENCOUNTER — Other Ambulatory Visit (HOSPITAL_COMMUNITY)
Admission: RE | Admit: 2020-05-16 | Discharge: 2020-05-16 | Disposition: A | Discharge status: Home / Self Care | Payer: BC Managed Care – PPO | Source: Ambulatory Visit | Attending: Obstetrics & Gynecology | Admitting: Obstetrics & Gynecology

## 2020-05-16 ENCOUNTER — Ambulatory Visit (INDEPENDENT_AMBULATORY_CARE_PROVIDER_SITE_OTHER): Payer: BC Managed Care – PPO | Admitting: Obstetrics & Gynecology

## 2020-05-16 ENCOUNTER — Encounter: Payer: Self-pay | Admitting: Obstetrics & Gynecology

## 2020-05-16 VITALS — BP 130/80 | Ht 64.0 in | Wt 159.0 lb

## 2020-05-16 DIAGNOSIS — Z124 Encounter for screening for malignant neoplasm of cervix: Secondary | ICD-10-CM | POA: Insufficient documentation

## 2020-05-16 DIAGNOSIS — Z01419 Encounter for gynecological examination (general) (routine) without abnormal findings: Secondary | ICD-10-CM

## 2020-05-16 DIAGNOSIS — R232 Flushing: Secondary | ICD-10-CM | POA: Diagnosis not present

## 2020-05-16 MED ORDER — CLONIDINE HCL 0.1 MG PO TABS
0.1000 mg | ORAL_TABLET | Freq: Every day | ORAL | 3 refills | Status: DC
Start: 2020-05-16 — End: 2020-07-19

## 2020-05-16 NOTE — Patient Instructions (Addendum)
PAP every three years Mammogram every year  Colonoscopy every 10 years Labs yearly (with PCP)  Thank you for choosing [facility]. As part of our ongoing efforts to improve patient experience, we would appreciate your feedback. Please fill out the short survey that you will receive by mail or MyChart. Your opinion is important to [place]! - Dr. Tiburcio Pea  Clonidine Oral Tablets What is this medicine? CLONIDINE (KLOE ni deen) is an alpha agonist. It treats high blood pressure. This medicine may be used for other purposes, such as symptoms of Menopause; ask your health care provider or pharmacist if you have questions. COMMON BRAND NAME(S): Catapres What should I tell my health care provider before I take this medicine? They need to know if you have any of these conditions:  kidney disease  an unusual or allergic reaction to clonidine, other medicines, foods, dyes, or preservatives  pregnant or trying to get pregnant  breast-feeding How should I use this medicine? Take this drug by mouth. Take it as directed on the prescription label at the same time every day. You can take it with or without food. If it upsets your stomach, take it with food. Keep taking it unless your health care provider tells you to stop. Talk to your health care provider about the use of this drug in children. Special care may be needed. Overdosage: If you think you have taken too much of this medicine contact a poison control center or emergency room at once. NOTE: This medicine is only for you. Do not share this medicine with others. What if I miss a dose? If you miss a dose, take it as soon as you can. If it is almost time for your next dose, take only that dose. Do not take double or extra doses. What may interact with this medicine? Do not take this medicine with any of the following medications:  MAOIs like Carbex, Eldepryl, Marplan, Nardil, and Parnate This medicine may also interact with the following  medications:  barbiturate medicines for inducing sleep or treating seizures like phenobarbital  certain medicines for blood pressure, heart disease, irregular heart beat  certain medicines for depression, anxiety, or psychotic disturbances  prescription pain medicines This list may not describe all possible interactions. Give your health care provider a list of all the medicines, herbs, non-prescription drugs, or dietary supplements you use. Also tell them if you smoke, drink alcohol, or use illegal drugs. Some items may interact with your medicine. What should I watch for while using this medicine? Visit your doctor or health care professional for regular checks on your progress. Check your heart rate and blood pressure regularly while you are taking this medicine. Ask your doctor or health care professional what your heart rate should be and when you should contact him or her. You may get drowsy or dizzy. Do not drive, use machinery, or do anything that needs mental alertness until you know how this medicine affects you. To avoid dizzy or fainting spells, do not stand or sit up quickly, especially if you are an older person. Alcohol can make you more drowsy and dizzy. Avoid alcoholic drinks. Your mouth may get dry. Chewing sugarless gum or sucking hard candy, and drinking plenty of water will help. Do not treat yourself for coughs, colds, or pain while you are taking this medicine without asking your doctor or health care professional for advice. Some ingredients may increase your blood pressure. If you are going to have surgery tell your doctor or  health care professional that you are taking this medicine. What side effects may I notice from receiving this medicine? Side effects that you should report to your doctor or health care professional as soon as possible:  allergic reactions like skin rash, itching or hives, swelling of the face, lips, or tongue  anxiety, nervousness  chest  pain  depression  fast, irregular heartbeat  swelling of feet or legs  unusually weak or tired Side effects that usually do not require medical attention (report to your doctor or health care professional if they continue or are bothersome):  change in sex drive or performance  constipation  headache This list may not describe all possible side effects. Call your doctor for medical advice about side effects. You may report side effects to FDA at 1-800-FDA-1088. Where should I keep my medicine? Keep out of the reach of children and pets. Store at room temperature between 15 and 30 degrees C (59 and 86 degrees F). Throw away any unused drug after the expiration date. NOTE: This sheet is a summary. It may not cover all possible information. If you have questions about this medicine, talk to your doctor, pharmacist, or health care provider.  2021 Elsevier/Gold Standard (2018-11-18 16:38:11)

## 2020-05-16 NOTE — Progress Notes (Signed)
HPI:      [name] is a 50 y.o. [id number] who LMP was in the past, she presents today for her annual examination.  The patient has no complaints today. The patient is sexually active. Herlast pap: approximate date 2019 and was normal and last mammogram: approximate date 2022 and was normal.  The patient does perform self breast exams.  There is no notable family history of breast or ovarian cancer in her family. The patient is not taking hormone replacement therapy. Patient denies post-menopausal vaginal bleeding.   The patient has regular exercise: yes. The patient denies current symptoms of depression.  She does reports mod-sev hot flashes, sweats, mood changes, and insomnia, w related weight gain.  She does have vag dryness and pain w intercourse.  She has tried OTC remedies and HRT (by PCP) in past without relief.  GYN Hx: Last Colonoscopy:1 year ago. Normal.   PMHx: Past Medical History:  Diagnosis Date  . Asthma    no flare up in years   . GERD (gastroesophageal reflux disease)   . Headache    migraines/ every few weeks  . Hot flashes due to menopause    Past Surgical History:  Procedure Laterality Date  . ABLATION    . COLONOSCOPY WITH PROPOFOL N/A 01/29/2020   Procedure: COLONOSCOPY WITH PROPOFOL;  Surgeon: Midge Minium, MD;  [facility];  Service: Endoscopy;  Laterality: N/A;  screening colonoscopy z12.11   . SINUS SURGERY WITH INSTATRAK  11/13/2019   x 6   Family History  Problem Relation Age of Onset  . Lung cancer Mother   . Diabetes Mother   . Heart failure Mother   . CAD Mother   . Heart disease Father   . Breast cancer Maternal Aunt    Social History   Tobacco Use  . Smoking status: Never Smoker  . Smokeless tobacco: Never Used  Vaping Use  . Vaping Use: Never used  Substance Use Topics  . Alcohol use: Yes    Alcohol/week: 7.0 standard drinks    Types: 7 Cans of beer per week  . Drug use: Not Currently    Current Outpatient  Medications:  .  cloNIDine (CATAPRES) 0.1 MG tablet, Take 1 tablet (0.1 mg total) by mouth daily., Disp: 90 tablet, Rfl: 3 .  fluticasone (FLONASE) 50 MCG/ACT nasal spray, Place into both nostrils daily., Disp: , Rfl:  .  traZODone (DESYREL) 25 mg TABS tablet, Take 25 mg by mouth at bedtime as needed. , Disp: , Rfl:  Allergies: Patient has no known allergies.  Review of Systems  Constitutional: Positive for malaise/fatigue. Negative for chills and fever.  HENT: Negative for congestion, sinus pain and sore throat.   Eyes: Negative for blurred vision and pain.  Respiratory: Negative for cough and wheezing.   Cardiovascular: Negative for chest pain and leg swelling.  Gastrointestinal: Negative for abdominal pain, constipation, diarrhea, heartburn, nausea and vomiting.  Genitourinary: Negative for dysuria, frequency, hematuria and urgency.  Musculoskeletal: Positive for joint pain. Negative for back pain, myalgias and neck pain.  Skin: Negative for itching and rash.  Neurological: Positive for headaches. Negative for dizziness, tremors and weakness.  Endo/Heme/Allergies: Does not bruise/bleed easily.  Psychiatric/Behavioral: Negative for depression. The patient is nervous/anxious. The patient does not have insomnia.     Objective: BP 130/80   Ht 5\' 4"  (1.626 m)   Wt 159 lb (72.1 kg)   BMI 27.29 kg/m   [facility] Weights   05/16/20 [phone]  Weight: 159 lb (72.1 kg)   Body mass index is 27.29 kg/m. Physical Exam Constitutional:      General: She is not in acute distress.    Appearance: She is well-developed and well-nourished.  Genitourinary:     Vagina, uterus and rectum normal.     There is no rash or lesion on the right labia.     There is no rash or lesion on the left labia.    No lesions in the vagina.     No vaginal bleeding.      Right Adnexa: not tender and no mass present.    Left Adnexa: not tender and no mass present.    No cervical motion tenderness, friability, lesion or  polyp.     Uterus is mobile.     Uterus is not enlarged.     No uterine mass detected.    Uterus is midaxial.     Pelvic exam was performed with patient supine.  Breasts:     Right: No mass, skin change or tenderness.     Left: No mass, skin change or tenderness.    HENT:     Head: Normocephalic and atraumatic. No laceration.     Right Ear: Hearing normal.     Left Ear: Hearing normal.     Nose: No epistaxis or foreign body.     Mouth/Throat:     Mouth: Oropharynx is clear and moist and mucous membranes are normal.     Pharynx: Uvula midline.  Eyes:     Pupils: Pupils are equal, round, and reactive to light.  Neck:     Thyroid: No thyromegaly.  Cardiovascular:     Rate and Rhythm: Normal rate and regular rhythm.     Heart sounds: No murmur heard. No friction rub. No gallop.   Pulmonary:     Effort: Pulmonary effort is normal. No respiratory distress.     Breath sounds: Normal breath sounds. No wheezing.  Abdominal:     General: Bowel sounds are normal. There is no distension.     Palpations: Abdomen is soft.     Tenderness: There is no abdominal tenderness. There is no rebound.  Musculoskeletal:        General: Normal range of motion.     Cervical back: Normal range of motion and neck supple.  Neurological:     Mental Status: She is alert and oriented to person, place, and time.     Cranial Nerves: No cranial nerve deficit.  Skin:    General: Skin is warm and dry.  Psychiatric:        Mood and Affect: Mood and affect normal.        Judgment: Judgment normal.  Vitals reviewed.     Assessment: Annual Exam 1. Women's annual routine gynecological examination   2. Screening for cervical cancer   3. Vasomotor flushing     Plan:            1.  Cervical Screening-  Pap smear done today  2. Breast screening- Exam annually and mammogram scheduled  3. Colonoscopy every 10 years, Hemoccult testing after age 54  4. Labs managed by PCP  5. Counseling for hormonal  therapy: none Plan CLonidine (prefers pill) 0.1 mg therapy and will monitor progress. Also counseled on dryness and dyspareunia, discussed Replens, and also vag ERT if persists    F/U  Return in about 1 year (around 05/16/2021) for Annual.  [name], MD, [facility], [facility] 05/16/2020  8:28 AM

## 2020-05-18 LAB — CYTOLOGY - PAP
Comment: NEGATIVE
Diagnosis: NEGATIVE
High risk HPV: NEGATIVE

## 2020-07-19 ENCOUNTER — Encounter: Payer: Self-pay | Admitting: Obstetrics & Gynecology

## 2020-07-19 ENCOUNTER — Telehealth (INDEPENDENT_AMBULATORY_CARE_PROVIDER_SITE_OTHER): Payer: BC Managed Care – PPO | Admitting: Obstetrics & Gynecology

## 2020-07-19 DIAGNOSIS — R232 Flushing: Secondary | ICD-10-CM | POA: Diagnosis not present

## 2020-07-19 DIAGNOSIS — Z780 Asymptomatic menopausal state: Secondary | ICD-10-CM

## 2020-07-19 MED ORDER — CLIMARA PRO 0.045-0.015 MG/DAY TD PTWK: 1.0000 | MEDICATED_PATCH | TRANSDERMAL | 12 refills | Status: DC

## 2020-07-19 NOTE — Patient Instructions (Signed)
Estradiol; Levonorgestrel skin patches What is this medicine? ESTRADIOL; LEVONORGESTREL (es tra DYE ole; LEE voh nor jes trel) is used as hormone replacement in menopausal women who still have their uterus. This medicine is used to relieve the symptoms of menopause. It also helps to prevent osteoporosis in postmenopausal women. This medicine may be used for other purposes; ask your health care provider or pharmacist if you have questions. COMMON BRAND NAME(S): Climara Pro What should I tell my health care provider before I take this medicine? They need to know if you have any of these conditions:  blood vessel disease or blood clots  breast, cervical, endometrial, or uterine cancer  diabetes  endometriosis  fibroids  gallbladder disease  heart disease or recent heart attack  high blood cholesterol  high blood pressure  high level of calcium in the blood  hysterectomy  kidney disease  liver disease  mental depression  migraine headaches  porphyria  stroke  systemic lupus erythematosus (SLE)  tobacco smoker  vaginal bleeding  an unusual or allergic reaction to estrogens, progestins, other medicines, foods, dyes, or preservatives  pregnant or trying to get pregnant  breast-feeding How should I use this medicine? This medicine is for external use only. Follow the directions on the prescription label. Tear open the pouch, do not use scissors. Remove the stiff protective liner covering the adhesive. Try not to touch the adhesive. Apply the patch, sticky side to the skin, to an area that is clean, dry and hairless. Avoid injured, irritated, calloused, or scarred areas. Do not apply the skin patches to your breasts or around the waistline. Use a different site each time to prevent skin irritation. Do not cut or trim the patch. Do not stop using except on the advice of your doctor or health care professional. Do not wear more than one patch at a time unless you are told  to do so by your doctor or health care professional. Contact your pediatrician regarding the use of this medicine in children. Special care may be needed. A patient package insert for the product will be given with each prescription and refill. Read this sheet carefully each time. The sheet may change frequently. Overdosage: If you think you have taken too much of this medicine contact a poison control center or emergency room at once. NOTE: This medicine is only for you. Do not share this medicine with others. What if I miss a dose? If you miss a dose, apply it as soon as you can. If it is almost time for your next dose, apply only that dose. Do not apply double or extra doses. What may interact with this medicine? Do not take this medicine with any of the following medications:  aromatase inhibitors like aminoglutethimide, anastrozole, exemestane, letrozole, testolactone This medicine may also interact with the following medications:  barbiturates, like phenobarbital  carbamazepine  certain antibiotics used to treat infections  grapefruit juice  medicines for fungus infections like itraconazole and ketoconazole  raloxifene or tamoxifen  rifabutin, rifampin, or rifapentine  ritonavir  St. John's Wort This list may not describe all possible interactions. Give your health care provider a list of all the medicines, herbs, non-prescription drugs, or dietary supplements you use. Also tell them if you smoke, drink alcohol, or use illegal drugs. Some items may interact with your medicine. What should I watch for while using this medicine? Visit your doctor or health care professional for regular checks on your progress. You will need a regular breast and  pelvic exam and Pap smear while on this medicine. You should also discuss the need for regular mammograms with your health care professional, and follow his or her guidelines for these tests. This medicine can make your body retain fluid,  making your fingers, hands, or ankles swell. Your blood pressure can go up. Contact your doctor or health care professional if you feel you are retaining fluid. If you have any reason to think you are pregnant, stop taking this medicine right away and contact your doctor or health care professional. Smoking increases the risk of getting a blood clot or having a stroke while you are taking this medicine, especially if you are more than 52 years old. You are strongly advised not to smoke. If you wear contact lenses and notice visual changes, or if the lenses begin to feel uncomfortable, consult your eye doctor or health care professional. If you are going to have surgery or an MRI, you may need to stop taking this medicine. Consult your health care professional for advice before you schedule the surgery. Contact with water while you are swimming, using a sauna, bathing, or showering may cause the patch to fall off. If your patch falls off reapply it. If you cannot reapply the patch, apply a new patch to another area and continue to follow your usual dose schedule. What side effects may I notice from receiving this medicine? Side effects that you should report to your doctor or health care professional as soon as possible:  breakthrough bleeding and spotting  breast tissue changes or discharge  chest pain  leg, arm or groin pain  nausea, vomiting  severe headaches  severe stomach pain  sudden shortness of breath  yellowing of the eyes or skin Side effects that usually do not require medical attention (report to your doctor or health care professional if they continue or are bothersome):  changes in sexual desire  mood changes, anxiety, depression, frustration, anger, or emotional outbursts  increased or decreased appetite  skin rash, acne, or brown spots on the skin  symptoms of vaginal infection like itching, irritation or unusual discharge  weight gain This list may not describe  all possible side effects. Call your doctor for medical advice about side effects. You may report side effects to FDA at 1-800-FDA-1088. Where should I keep my medicine? Keep out of the reach of children. Store at room temperature between 15 and 30 degrees C (59 and 86 degrees F). Do not store any patches that have been removed from their protective pouch. Throw away any unused medicine after the expiration date. Dispose of used patches properly. Since used patches may still contain active hormones, fold the patch in half so that it sticks to itself prior to disposal. NOTE: This sheet is a summary. It may not cover all possible information. If you have questions about this medicine, talk to your doctor, pharmacist, or health care provider.  2021 Elsevier/Gold Standard (2015-10-04 12:49:52)

## 2020-07-19 NOTE — Progress Notes (Signed)
Virtual Visit via Video Note  I connected with Kirsten Patterson on 07/19/20 at  8:20 AM EDT by a video enabled telemedicine application and verified that I am speaking with the correct person using two identifiers.  Location: Patient: Home Provider: Office   I discussed the limitations of evaluation and management by telemedicine and the availability of in person appointments. The patient expressed understanding and agreed to proceed.  History of Present Illness: Kirsten Patterson is a 50 y.o. who was started on Clonidine 0.1 mg daily pill approximately 2 months ago. Since that time, she states that her symptoms show no change.  She also has had more drowsiness while taking this.  Her main sx's were hot flashes and night sweats, but she also reports insomnia, not feeling rested after sleep, low libido, and vaginal dryness/dyspareunia.  No period since ablation many years ago.  PMHx: She  has a past medical history of Asthma, GERD (gastroesophageal reflux disease), Headache, and Hot flashes due to menopause. Also,  has a past surgical history that includes Ablation; Sinus surgery with Instatrak (11/13/2019); and Colonoscopy with propofol (N/A, 01/29/2020)., family history includes Breast cancer in her maternal aunt; CAD in her mother; Diabetes in her mother; Heart disease in her father; Heart failure in her mother; Lung cancer in her mother.,  reports that she has never smoked. She has never used smokeless tobacco. She reports current alcohol use of about 7.0 standard drinks of alcohol per week. She reports previous drug use. Meds- Clonidine 0.1 mg daily pill Also, has No Known Allergies..  Review of Systems  All other systems reviewed and are negative.   Observations/Objective: No exam today, due to telephone eVisit due to [facility] virus restriction on elective visits and procedures.  Prior visits reviewed along with ultrasounds/labs as indicated.  Assessment and Plan:   ICD-10-CM   1. Vasomotor  flushing  R23.2   2. Menopause  Z78.0 estradiol-levonorgestrel (CLIMARA PRO) 0.045-0.015 MG/DAY  Pt did not do well w Clonidine (no relief of sx's, side effect of drowsiness) Pros and cons of HRT d/w pt (see below discussion points); plan patch weekly therapy Recheck on pt in 8 weeks  HRT I have discussed HRT with the patient in detail.  The risk/benefits of it were reviewed.  She understands that during menopause Estrogen decreases dramatically and that this results in an increased risk of cardiovascular disease as well as osteoporosis.  We have also discussed the fact that hot flashes often result from a decrease in Estrogen, and that by replacing Estrogen, they can often be alleviated.  We have discussed skin, vaginal and urinary tract changes that may also take place from this drop in Estrogen.  Emotional changes have also been linked to Estrogen and we have briefly discussed this.  The benefits of HRT including decrease in hot flashes, vaginal dryness, and osteoporosis were discussed.  The emotional benefit and a possible change in her cardiovascular risk profile was also reviewed.  The risks associated with Hormone Replacement Therapy were also reviewed.  The use of unopposed Estrogen and its relationship to endometrial cancer was discussed.  The addition of Progesterone and its beneficial effect on endometrial cancer was also noted.  The fact that there has been no consistent definitive studies showing an increase in breast cancer in women who use HRT was discussed with the patient.  The possible side effects including breast tenderness, fluid retention, mood changes and vaginal bleeding were discussed.  The patient was informed that this is  an elective medication and that she may choose not to take Hormone Replacement Therapy.  Literature on HRT was given, and I believe that after answering all of the patient's questions, she has an adequate and informed understanding of HRT.  Special emphasis on the  WHI study, as well as several studies since that pertaining to the risks and benefits of estrogen replacement therapy were compared.  The possible limitations of these studies were discussed including the age stratification of the WHI study.  The possible role of Progesterone in these studies was discussed in detail.  I believe that the patient has an informed knowledge of the risks and benefits of HRT.  I have specifically discussed WHI findings and current updates.  Different type of hormone formulation and methods of taking hormone replacement therapy discussed.   Follow Up Instructions: 2 mos   I discussed the assessment and treatment plan with the patient. The patient was provided an opportunity to ask questions and all were answered. The patient agreed with the plan and demonstrated an understanding of the instructions.   The patient was advised to call back or seek an in-person evaluation if the symptoms worsen or if the condition fails to improve as anticipated.  A total of 20 minutes were spent face-to-face (video) with the patient as well as preparation, review, communication, and documentation during this encounter elated to menopause and hormone therapy.   [name], MD, [facility], [facility] 07/19/2020  8:47 AM

## 2020-07-20 ENCOUNTER — Other Ambulatory Visit: Payer: Self-pay | Admitting: Obstetrics & Gynecology

## 2020-07-20 DIAGNOSIS — Z780 Asymptomatic menopausal state: Secondary | ICD-10-CM

## 2020-07-20 MED ORDER — ESTRADIOL-NORETHINDRONE ACET 1-0.5 MG PO TABS: 1.0000 | ORAL_TABLET | Freq: Every day | ORAL | 3 refills | Status: DC

## 2020-09-19 ENCOUNTER — Encounter: Payer: Self-pay | Admitting: Obstetrics & Gynecology

## 2020-09-19 ENCOUNTER — Telehealth (INDEPENDENT_AMBULATORY_CARE_PROVIDER_SITE_OTHER): Payer: BC Managed Care – PPO | Admitting: Obstetrics & Gynecology

## 2020-09-19 ENCOUNTER — Other Ambulatory Visit: Payer: Self-pay

## 2020-09-19 DIAGNOSIS — R232 Flushing: Secondary | ICD-10-CM | POA: Diagnosis not present

## 2020-09-19 DIAGNOSIS — Z79890 Hormone replacement therapy (postmenopausal): Secondary | ICD-10-CM | POA: Diagnosis not present

## 2020-09-19 DIAGNOSIS — Z780 Asymptomatic menopausal state: Secondary | ICD-10-CM

## 2020-09-19 DIAGNOSIS — N951 Menopausal and female climacteric states: Secondary | ICD-10-CM

## 2020-09-19 NOTE — Progress Notes (Signed)
Virtual Visit via Video Note  I connected with Kirsten Patterson on 09/19/20 at  8:20 AM EDT by a video enabled telemedicine application and verified that I am speaking with the correct person using two identifiers.  Location: Patient: Home Provider: Office   I discussed the limitations of evaluation and management by telemedicine and the availability of in person appointments. The patient expressed understanding and agreed to proceed.  History of Present Illness:  Kirsten Patterson is a 50 y.o. who was started on HRT  approximately 2 months ago. Since that time, she states that her symptoms are improving.  She has improved hot flashes and night sweats, some w insominia, no change in libido or vag dryness yet.  Some vag itching side effects noted intermittently.  PMHx: She  has a past medical history of Asthma, GERD (gastroesophageal reflux disease), Headache, and Hot flashes due to menopause. Also,  has a past surgical history that includes Ablation; Sinus surgery with Instatrak (11/13/2019); and Colonoscopy with propofol (N/A, 01/29/2020)., family history includes Breast cancer in her maternal aunt; CAD in her mother; Diabetes in her mother; Heart disease in her father; Heart failure in her mother; Lung cancer in her mother.,  reports that she has never smoked. She has never used smokeless tobacco. She reports current alcohol use of about 7.0 standard drinks of alcohol per week. She reports previous drug use. No outpatient medications have been marked as taking for the 09/19/20 encounter (Video Visit) with Nadara Mustard, MD.  . Also, has No Known Allergies..  Review of Systems  All other systems reviewed and are negative.   Observations/Objective:   Assessment and Plan:   ICD-10-CM   1. Menopause  Z78.0     2. Vasomotor flushing  R23.2       Medication treatment is going well for her menopause sx's.  Plan: She will undergo no change in her medical therapy.  She was amenable to this  plan and we will see her back for annual/PRN. Follow Up Instructions: PRN/ Annual   I discussed the assessment and treatment plan with the patient. The patient was provided an opportunity to ask questions and all were answered. The patient agreed with the plan and demonstrated an understanding of the instructions.   The patient was advised to call back or seek an in-person evaluation if the symptoms worsen or if the condition fails to improve as anticipated.  A total of 20 minutes were spent face-to-face with the patient as well as preparation, review, communication, and documentation during this encounter.   [name], MD, [facility], [facility] 09/19/2020  8:19 AM

## 2020-10-23 DIAGNOSIS — Z20822 Contact with and (suspected) exposure to covid-19: Secondary | ICD-10-CM | POA: Diagnosis not present

## 2020-11-15 DIAGNOSIS — Z9109 Other allergy status, other than to drugs and biological substances: Secondary | ICD-10-CM | POA: Diagnosis not present

## 2020-11-15 DIAGNOSIS — J4520 Mild intermittent asthma, uncomplicated: Secondary | ICD-10-CM | POA: Diagnosis not present

## 2020-11-15 DIAGNOSIS — Z1389 Encounter for screening for other disorder: Secondary | ICD-10-CM | POA: Diagnosis not present

## 2020-11-15 DIAGNOSIS — Z13220 Encounter for screening for lipoid disorders: Secondary | ICD-10-CM | POA: Diagnosis not present

## 2020-11-15 DIAGNOSIS — Z8679 Personal history of other diseases of the circulatory system: Secondary | ICD-10-CM | POA: Diagnosis not present

## 2020-11-15 DIAGNOSIS — R079 Chest pain, unspecified: Secondary | ICD-10-CM | POA: Diagnosis not present

## 2020-11-15 DIAGNOSIS — Z0389 Encounter for observation for other suspected diseases and conditions ruled out: Secondary | ICD-10-CM | POA: Diagnosis not present

## 2020-11-15 DIAGNOSIS — R0602 Shortness of breath: Secondary | ICD-10-CM | POA: Diagnosis not present

## 2020-11-15 DIAGNOSIS — Z0000 Encounter for general adult medical examination without abnormal findings: Secondary | ICD-10-CM | POA: Diagnosis not present

## 2020-11-23 DIAGNOSIS — M205X1 Other deformities of toe(s) (acquired), right foot: Secondary | ICD-10-CM | POA: Diagnosis not present

## 2020-11-23 DIAGNOSIS — M6740 Ganglion, unspecified site: Secondary | ICD-10-CM | POA: Diagnosis not present

## 2020-11-23 DIAGNOSIS — M79671 Pain in right foot: Secondary | ICD-10-CM | POA: Diagnosis not present

## 2020-11-23 DIAGNOSIS — M2011 Hallux valgus (acquired), right foot: Secondary | ICD-10-CM | POA: Diagnosis not present

## 2020-12-16 ENCOUNTER — Ambulatory Visit: Payer: BC Managed Care – PPO | Admitting: Cardiology

## 2021-02-20 DIAGNOSIS — R002 Palpitations: Secondary | ICD-10-CM | POA: Diagnosis not present

## 2021-02-22 DIAGNOSIS — M2012 Hallux valgus (acquired), left foot: Secondary | ICD-10-CM | POA: Diagnosis not present

## 2021-02-22 DIAGNOSIS — M79672 Pain in left foot: Secondary | ICD-10-CM | POA: Diagnosis not present

## 2021-02-22 DIAGNOSIS — M205X1 Other deformities of toe(s) (acquired), right foot: Secondary | ICD-10-CM | POA: Diagnosis not present

## 2021-02-22 DIAGNOSIS — M6740 Ganglion, unspecified site: Secondary | ICD-10-CM | POA: Diagnosis not present

## 2021-02-24 ENCOUNTER — Other Ambulatory Visit: Payer: Self-pay | Admitting: Podiatry

## 2021-02-27 ENCOUNTER — Other Ambulatory Visit: Payer: Self-pay | Admitting: Podiatry

## 2021-03-02 ENCOUNTER — Encounter: Payer: Self-pay | Admitting: Podiatry

## 2021-03-08 NOTE — Discharge Instructions (Addendum)
[facility]  POST OPERATIVE INSTRUCTIONS FOR DR. Ether Griffins AND DR. Marvell Tamer [facility] PODIATRY DEPARTMENT   Take your medication as prescribed.  Pain medication should be taken only as needed.  May take ibuprofen or tylenol between doses as well for relief as needed.  Keep the dressing clean, dry and intact.  Continue partial weightbearing with heel contact in boot left side for short distances.  Keep your foot elevated above the heart level for the first 48 hours.  Walking to the bathroom and brief periods of walking are acceptable, unless we have instructed you to be non-weight bearing.  Always wear your post-op shoe when walking.  Always use your crutches if you are to be non-weight bearing.  Do not take a shower. Baths are permissible as long as the foot is kept out of the water.   Every hour you are awake:  Bend your knee 15 times. Massage calf 15 times  Call [facility] [phone]) if any of the following problems occur: You develop a temperature or fever. The bandage becomes saturated with blood. Medication does not stop your pain. Injury of the foot occurs. Any symptoms of infection including redness, odor, or red streaks running from wound.   PERIPHERAL NERVE BLOCK PATIENT INFORMATION  Your surgeon has requested a peripheral nerve block for your surgery. This anesthetic technique provides excellent post-operative pain relief for you in a safe and effective manner. It will also help reduce the risk of nausea and vomiting and allow earlier discharge from the hospital.   The block is performed under sedation with ultrasound guidance prior to your procedure. Due to the sedation, your may or may not remember the block experience. The nerve block will begin to take effect anywhere from 5 to 30 minutes after being administered. You will be transported to the operating room from your surgery after the block is completed.   At the  end of surgery, when the anesthesia wears off, you will notice a few things. Your may not be able to move or feel the part of your body targeted by the nerve block. These are normal experiences, and they will disappear as the block wears off.  If you had an interscalene nerve block performed (which is common for shoulder surgery), your voice can be very hoarse and you may feel that you are not able to take as deep a breath as you did before surgery. Some patients may also notice a droopy eyelid on the affected side. These symptoms will resolve once the block wears off.  Pain control: The nerve block technique used is a single injection that can last anywhere from 1-3 days. The duration of the numbness can vary between individuals. After leaving the hospital, it is important that you begin to take your prescribed pain medication when you start to sense the nerve block wearing off. This will help you avoid unpleasant pain at the time the nerve block wears off, which can sometimes be in the middle of the night. The block will only cover pain in the areas targeted by the nerve block so if you experience surgical pain outside of that area, please take your prescribed pain medication. Management of the numb area: After a nerve block, you cannot feel pain, pressure, or temperature in the affected area so there is an increased risk for injury. You should take extra care to protect the affected areas until sensation and movement returns. Please take caution to not come in contact  with extremely hot or cold items because you will not be able to sense or protect yourself form the extremes of temperature.  You may experience some persistent numbness after the procedure by most neurological deficits resolve over time and the incidence of serious long term neurological complications attributable to peripheral nerve blocks are relatively uncommon.     Information for Discharge Teaching: EXPAREL (bupivacaine liposome  injectable suspension)   Your surgeon or anesthesiologist gave you EXPAREL(bupivacaine) to help control your pain after surgery.  EXPAREL is a local anesthetic that provides pain relief by numbing the tissue around the surgical site. EXPAREL is designed to release pain medication over time and can control pain for up to 72 hours. Depending on how you respond to EXPAREL, you may require less pain medication during your recovery.  Possible side effects: Temporary loss of sensation or ability to move in the area where bupivacaine was injected. Nausea, vomiting, constipation Rarely, numbness and tingling in your mouth or lips, lightheadedness, or anxiety may occur. Call your doctor right away if you think you may be experiencing any of these sensations, or if you have other questions regarding possible side effects.  Follow all other discharge instructions given to you by your surgeon or nurse. Eat a healthy diet and drink plenty of water or other fluids.  If you return to the hospital for any reason within 96 hours following the administration of EXPAREL, it is important for health care providers to know that you have received this anesthetic. A teal colored band has been placed on your arm with the date, time and amount of EXPAREL you have received in order to alert and inform your health care providers. Please leave this armband in place for the full 96 hours following administration, and then you may remove the band.

## 2021-03-09 ENCOUNTER — Ambulatory Visit: Payer: BC Managed Care – PPO | Admitting: Anesthesiology

## 2021-03-09 ENCOUNTER — Ambulatory Visit: Payer: Self-pay

## 2021-03-09 ENCOUNTER — Encounter
Admission: RE | Disposition: A | Discharge status: Home / Self Care | Payer: Self-pay | Source: Home / Self Care | Attending: Podiatry

## 2021-03-09 ENCOUNTER — Ambulatory Visit
Admission: RE | Admit: 2021-03-09 | Discharge: 2021-03-09 | Disposition: A | Discharge status: Home / Self Care | Payer: BC Managed Care – PPO | Attending: Podiatry | Admitting: Podiatry

## 2021-03-09 ENCOUNTER — Encounter: Payer: Self-pay | Admitting: Podiatry

## 2021-03-09 ENCOUNTER — Other Ambulatory Visit: Payer: Self-pay

## 2021-03-09 DIAGNOSIS — M2042 Other hammer toe(s) (acquired), left foot: Secondary | ICD-10-CM | POA: Insufficient documentation

## 2021-03-09 DIAGNOSIS — M21612 Bunion of left foot: Secondary | ICD-10-CM

## 2021-03-09 DIAGNOSIS — M67472 Ganglion, left ankle and foot: Secondary | ICD-10-CM | POA: Insufficient documentation

## 2021-03-09 DIAGNOSIS — M2012 Hallux valgus (acquired), left foot: Secondary | ICD-10-CM | POA: Insufficient documentation

## 2021-03-09 DIAGNOSIS — G8918 Other acute postprocedural pain: Secondary | ICD-10-CM | POA: Diagnosis not present

## 2021-03-09 DIAGNOSIS — J45909 Unspecified asthma, uncomplicated: Secondary | ICD-10-CM | POA: Insufficient documentation

## 2021-03-09 DIAGNOSIS — M79672 Pain in left foot: Secondary | ICD-10-CM | POA: Diagnosis not present

## 2021-03-09 DIAGNOSIS — M899 Disorder of bone, unspecified: Secondary | ICD-10-CM | POA: Diagnosis not present

## 2021-03-09 DIAGNOSIS — K219 Gastro-esophageal reflux disease without esophagitis: Secondary | ICD-10-CM | POA: Diagnosis not present

## 2021-03-09 DIAGNOSIS — M25872 Other specified joint disorders, left ankle and foot: Secondary | ICD-10-CM | POA: Diagnosis not present

## 2021-03-09 DIAGNOSIS — M6740 Ganglion, unspecified site: Secondary | ICD-10-CM | POA: Diagnosis not present

## 2021-03-09 HISTORY — DX: Presence of spectacles and contact lenses: Z97.3

## 2021-03-09 HISTORY — DX: Encounter for fitting and adjustment of orthodontic device: Z46.4

## 2021-03-09 HISTORY — PX: BUNIONECTOMY: SHX129

## 2021-03-09 HISTORY — PX: HAMMER TOE SURGERY: SHX385

## 2021-03-09 HISTORY — DX: Palpitations: R00.2

## 2021-03-09 HISTORY — DX: Reserved for inherently not codable concepts without codable children: IMO0001

## 2021-03-09 SURGERY — CORRECTION, HAMMER TOE
Anesthesia: Regional | Site: Toe | Laterality: Left

## 2021-03-09 MED ORDER — HYDROCODONE-ACETAMINOPHEN 7.5-325 MG PO TABS: 1.0000 | ORAL_TABLET | Freq: Four times a day (QID) | ORAL | 0 refills | Status: AC | PRN

## 2021-03-09 MED ORDER — MIDAZOLAM HCL 2 MG/2ML IJ SOLN
INTRAMUSCULAR | Status: DC | PRN
  Administered 2021-03-09: 2 mg via INTRAVENOUS

## 2021-03-09 MED ORDER — HYDROMORPHONE HCL 1 MG/ML IJ SOLN: 0.2500 mg | INTRAMUSCULAR | Status: DC | PRN

## 2021-03-09 MED ORDER — ASPIRIN EC 81 MG PO TBEC: 81.0000 mg | DELAYED_RELEASE_TABLET | Freq: Two times a day (BID) | ORAL | 0 refills | Status: AC

## 2021-03-09 MED ORDER — OXYCODONE HCL 5 MG PO TABS: 5.0000 mg | ORAL_TABLET | Freq: Once | ORAL | Status: DC | PRN

## 2021-03-09 MED ORDER — LIDOCAINE HCL (CARDIAC) PF 100 MG/5ML IV SOSY
PREFILLED_SYRINGE | INTRAVENOUS | Status: DC | PRN
  Administered 2021-03-09: 60 mg via INTRAVENOUS

## 2021-03-09 MED ORDER — OXYCODONE HCL 5 MG/5ML PO SOLN: 5.0000 mg | Freq: Once | ORAL | Status: DC | PRN

## 2021-03-09 MED ORDER — BUPIVACAINE HCL (PF) 0.5 % IJ SOLN
INTRAMUSCULAR | Status: DC | PRN
  Administered 2021-03-09: 100 mg

## 2021-03-09 MED ORDER — BUPIVACAINE LIPOSOME 1.3 % IJ SUSP
INTRAMUSCULAR | Status: DC | PRN
  Administered 2021-03-09: 266 mg

## 2021-03-09 MED ORDER — ONDANSETRON HCL 4 MG PO TABS: 4.0000 mg | ORAL_TABLET | Freq: Three times a day (TID) | ORAL | 0 refills | Status: DC | PRN

## 2021-03-09 MED ORDER — AMOXICILLIN-POT CLAVULANATE 875-125 MG PO TABS: 1.0000 | ORAL_TABLET | Freq: Two times a day (BID) | ORAL | 0 refills | Status: AC

## 2021-03-09 MED ORDER — FENTANYL CITRATE (PF) 100 MCG/2ML IJ SOLN
INTRAMUSCULAR | Status: DC | PRN
  Administered 2021-03-09: 100 ug via INTRAVENOUS

## 2021-03-09 MED ORDER — PROPOFOL 10 MG/ML IV BOLUS
INTRAVENOUS | Status: DC | PRN
  Administered 2021-03-09: 100 ug/kg/min via INTRAVENOUS

## 2021-03-09 MED ORDER — CEFAZOLIN SODIUM-DEXTROSE 2-4 GM/100ML-% IV SOLN
2.0000 g | INTRAVENOUS | Status: AC
  Administered 2021-03-09: 2 g via INTRAVENOUS

## 2021-03-09 MED ORDER — 0.9 % SODIUM CHLORIDE (POUR BTL) OPTIME
TOPICAL | Status: DC | PRN
  Administered 2021-03-09: 1000 mL

## 2021-03-09 MED ORDER — PROMETHAZINE HCL 25 MG/ML IJ SOLN: 6.2500 mg | INTRAMUSCULAR | Status: DC | PRN

## 2021-03-09 MED ORDER — DEXAMETHASONE SODIUM PHOSPHATE 4 MG/ML IJ SOLN
INTRAMUSCULAR | Status: DC | PRN
  Administered 2021-03-09: 4 mg via INTRAVENOUS

## 2021-03-09 MED ORDER — ONDANSETRON HCL 4 MG/2ML IJ SOLN
INTRAMUSCULAR | Status: DC | PRN
  Administered 2021-03-09: 4 mg via INTRAVENOUS

## 2021-03-09 MED ORDER — LACTATED RINGERS IV SOLN: INTRAVENOUS | Status: DC

## 2021-03-09 SURGICAL SUPPLY — 70 items
APL SKNCLS STERI-STRIP NONHPOA (GAUZE/BANDAGES/DRESSINGS) ×1
BENZOIN TINCTURE PRP APPL 2/3 (GAUZE/BANDAGES/DRESSINGS) ×2 IMPLANT
BIT DRILL 1.7 LNG CANN (DRILL) ×1 IMPLANT
BLADE MED AGGRESSIVE (BLADE) ×1 IMPLANT
BLADE OSC/SAGITTAL MD 5.5X18 (BLADE) ×1 IMPLANT
BLADE SURG 15 STRL LF DISP TIS (BLADE) ×1 IMPLANT
BLADE SURG 15 STRL SS (BLADE) ×2
BNDG CMPR 75X41 PLY HI ABS (GAUZE/BANDAGES/DRESSINGS) ×1
BNDG CMPR STD VLCR NS LF 5.8X4 (GAUZE/BANDAGES/DRESSINGS) ×1
BNDG CMPR STD VLCR NS LF 5.8X6 (GAUZE/BANDAGES/DRESSINGS) ×1
BNDG COHESIVE 4X5 TAN STRL (GAUZE/BANDAGES/DRESSINGS) ×2 IMPLANT
BNDG ELASTIC 4X5.8 VLCR NS LF (GAUZE/BANDAGES/DRESSINGS) ×2 IMPLANT
BNDG ELASTIC 6X5.8 VLCR NS LF (GAUZE/BANDAGES/DRESSINGS) ×2 IMPLANT
BNDG ESMARK 4X12 TAN STRL LF (GAUZE/BANDAGES/DRESSINGS) ×2 IMPLANT
BNDG GAUZE 4.5X4.1 6PLY STRL (MISCELLANEOUS) ×2 IMPLANT
BNDG GAUZE ELAST 4 BULKY (GAUZE/BANDAGES/DRESSINGS) ×2 IMPLANT
BNDG STRETCH 4X75 STRL LF (GAUZE/BANDAGES/DRESSINGS) ×2 IMPLANT
CANISTER SUCT 1200ML W/VALVE (MISCELLANEOUS) ×2 IMPLANT
COUNTERSINK HEADED 2.5 (ORTHOPEDIC DISPOSABLE SUPPLIES) ×2
COVER LIGHT HANDLE UNIVERSAL (MISCELLANEOUS) ×4 IMPLANT
CUFF TOURN SGL QUICK 18X4 (TOURNIQUET CUFF) IMPLANT
DRAPE FLUOR MINI C-ARM 54X84 (DRAPES) ×2 IMPLANT
DURAPREP 26ML APPLICATOR (WOUND CARE) ×2 IMPLANT
ELECT REM PT RETURN 9FT ADLT (ELECTROSURGICAL) ×2
ELECTRODE REM PT RTRN 9FT ADLT (ELECTROSURGICAL) ×1 IMPLANT
GAUZE SPONGE 4X4 12PLY STRL (GAUZE/BANDAGES/DRESSINGS) ×2 IMPLANT
GAUZE XEROFORM 1X8 LF (GAUZE/BANDAGES/DRESSINGS) ×2 IMPLANT
GLOVE BIO SURGEON STRL SZ7 (GLOVE) ×2 IMPLANT
GLOVE BIOGEL PI IND STRL 7.0 (GLOVE) ×1 IMPLANT
GLOVE BIOGEL PI INDICATOR 7.0 (GLOVE) ×1
GLOVE SURG ENC MOIS LTX SZ7 (GLOVE) ×2 IMPLANT
GLOVE SURG POLYISO LF SZ7 (GLOVE) ×2 IMPLANT
GOWN STRL REUS W/ TWL LRG LVL3 (GOWN DISPOSABLE) ×2 IMPLANT
GOWN STRL REUS W/TWL LRG LVL3 (GOWN DISPOSABLE) ×4
K-WIRE DBL END TROCAR 6X.045 (WIRE)
K-WIRE DBL END TROCAR 6X.062 (WIRE)
KIT TURNOVER KIT A (KITS) ×2 IMPLANT
KWIRE DBL END TROCAR 6X.045 (WIRE) IMPLANT
KWIRE DBL END TROCAR 6X.062 (WIRE) IMPLANT
NS IRRIG 500ML POUR BTL (IV SOLUTION) ×2 IMPLANT
PACK EXTREMITY ARMC (MISCELLANEOUS) ×2 IMPLANT
PADDING CAST BLEND 4X4 NS (MISCELLANEOUS) ×8 IMPLANT
PENCIL SMOKE EVACUATOR (MISCELLANEOUS) ×2 IMPLANT
PIN BALLS 3/8 F/.045 WIRE (MISCELLANEOUS) ×2 IMPLANT
RASP SM TEAR CROSS CUT (RASP) IMPLANT
SCREW 2.5X24 HEADED (Screw) ×1 IMPLANT
SCREW COUNTERSINK HEADED 2.5 (ORTHOPEDIC DISPOSABLE SUPPLIES) IMPLANT
SPLINT CAST 1 STEP 4X30 (MISCELLANEOUS) ×2 IMPLANT
STAPLE SYSTEM ANGLED 10X10 (Staple) ×1 IMPLANT
STOCKINETTE IMPERVIOUS LG (DRAPES) ×2 IMPLANT
STOCKINETTE STRL 6IN 960660 (GAUZE/BANDAGES/DRESSINGS) ×2 IMPLANT
STRIP CLOSURE SKIN 1/4X4 (GAUZE/BANDAGES/DRESSINGS) ×2 IMPLANT
SUT ETHILON 4-0 (SUTURE)
SUT ETHILON 4-0 FS2 18XMFL BLK (SUTURE)
SUT ETHILON 5-0 FS-2 18 BLK (SUTURE) IMPLANT
SUT MNCRL 4-0 (SUTURE) ×2
SUT MNCRL 4-0 27XMFL (SUTURE) ×1
SUT MNCRL 5-0+ PC-1 (SUTURE) IMPLANT
SUT MNCRL+ 5-0 UNDYED PC-3 (SUTURE) IMPLANT
SUT MONOCRYL 5-0 (SUTURE)
SUT VIC AB 0 CT1 27 (SUTURE)
SUT VIC AB 0 CT1 27XCR 8 STRN (SUTURE) IMPLANT
SUT VIC AB 2-0 SH 27 (SUTURE)
SUT VIC AB 2-0 SH 27XBRD (SUTURE) IMPLANT
SUT VIC AB 3-0 SH 27 (SUTURE) ×2
SUT VIC AB 3-0 SH 27X BRD (SUTURE) IMPLANT
SUT VIC AB 4-0 FS2 27 (SUTURE) ×1 IMPLANT
SUTURE ETHLN 4-0 FS2 18XMF BLK (SUTURE) IMPLANT
SUTURE MNCRL 4-0 27XMF (SUTURE) IMPLANT
WIRE SMOOTH TROCAR .9MMX150MML (WIRE) ×1 IMPLANT

## 2021-03-09 NOTE — Anesthesia Preprocedure Evaluation (Signed)
Anesthesia Evaluation  Patient identified by MRN, date of birth, ID band Patient awake    Reviewed: Allergy & Precautions, H&P , NPO status , Patient's Chart, lab work & pertinent test results, reviewed documented beta blocker date and time   Airway Mallampati: II  TM Distance: >3 FB Neck ROM: full    Dental no notable dental hx.    Pulmonary asthma ,    Pulmonary exam normal breath sounds clear to auscultation       Cardiovascular Exercise Tolerance: Good negative cardio ROS   Rhythm:regular Rate:Normal     Neuro/Psych  Headaches, negative psych ROS   GI/Hepatic negative GI ROS, Neg liver ROS, GERD  ,  Endo/Other  negative endocrine ROS  Renal/GU negative Renal ROS  negative genitourinary   Musculoskeletal   Abdominal   Peds  Hematology negative hematology ROS (+)   Anesthesia Other Findings   Reproductive/Obstetrics negative OB ROS                             Anesthesia Physical Anesthesia Plan  ASA: 2  Anesthesia Plan: General and Regional   Post-op Pain Management:    Induction:   PONV Risk Score and Plan: 3 and Ondansetron and Treatment may vary due to age or medical condition  Airway Management Planned:   Additional Equipment:   Intra-op Plan:   Post-operative Plan:   Informed Consent: I have reviewed the patients History and Physical, chart, labs and discussed the procedure including the risks, benefits and alternatives for the proposed anesthesia with the patient or authorized representative who has indicated his/her understanding and acceptance.     Dental Advisory Given  Plan Discussed with: CRNA  Anesthesia Plan Comments:         Anesthesia Quick Evaluation

## 2021-03-09 NOTE — H&P (Signed)
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  The various methods of treatment have been discussed with the patient.  No guarantees were given.  After consideration of risks, benefits and other options for treatment, the patient has consented to surgery.  I have reviewed the patients chart and labs.    PROCEDURE: LEFT AUSTIN/AKIN BUNIONECTOMY WITH CHELIECTOMY LEFT HAMMER TOE REPAIR 2ND DIPJ WITH CYST RESECTION  A history and physical examination was performed in my office.  The patient was reexamined.  There have been no changes to this history and physical examination.  Rosetta Posner, DPM

## 2021-03-09 NOTE — Anesthesia Procedure Notes (Signed)
Anesthesia Regional Block: Popliteal block   Pre-Anesthetic Checklist: , timeout performed,  Correct Patient, Correct Site, Correct Laterality,  Correct Procedure, Correct Position, site marked,  Risks and benefits discussed,  Surgical consent,  Pre-op evaluation,  At surgeon's request and post-op pain management  Laterality: Left  Prep: chloraprep       Needles:  Injection technique: Single-shot  Needle Type: Stimiplex      Needle Gauge: 20     Additional Needles:   Procedures:,,,, ultrasound used (permanent image in chart),,    Narrative:  Start time: 03/09/2021 7:12 AM End time: 03/09/2021 7:20 AM Injection made incrementally with aspirations every 5 mL.  Performed by: Personally  Anesthesiologist: [name], MD

## 2021-03-09 NOTE — Anesthesia Procedure Notes (Signed)
Anesthesia Regional Block: Adductor canal block   Pre-Anesthetic Checklist: , timeout performed,  Correct Patient, Correct Site, Correct Laterality,  Correct Procedure, Correct Position, site marked,  Risks and benefits discussed,  Surgical consent,  Pre-op evaluation,  At surgeon's request and post-op pain management  Laterality: Left  Prep: chloraprep       Needles:  Injection technique: Single-shot  Needle Type: Stimiplex      Needle Gauge: 20     Additional Needles:   Procedures:,,,, ultrasound used (permanent image in chart),,    Narrative:  Start time: 03/09/2021 7:12 AM End time: 03/09/2021 7:20 AM Injection made incrementally with aspirations every 5 mL.  Performed by: Personally  Anesthesiologist: [name], MD

## 2021-03-09 NOTE — Progress Notes (Signed)
Assisted Kirsten Patterson ANMD with left, ultrasound guided, popliteal/saphenous block. Side rails up, monitors on throughout procedure. See vital signs in flow sheet. Tolerated Procedure well.

## 2021-03-09 NOTE — Anesthesia Postprocedure Evaluation (Signed)
Anesthesia Post Note  Patient: Kirsten Patterson  Procedure(s) Performed: HAMMER TOE CORRECTION - 2ND TOE (Left: Toe) BUNIONECTOMY - DOUBLE OSTEOTOMY (Left: Toe)     Patient location during evaluation: PACU Anesthesia Type: Regional Level of consciousness: awake and alert Pain management: pain level controlled Vital Signs Assessment: post-procedure vital signs reviewed and stable Respiratory status: spontaneous breathing, nonlabored ventilation and respiratory function stable Cardiovascular status: blood pressure returned to baseline and stable Postop Assessment: no apparent nausea or vomiting Anesthetic complications: no   No notable events documented.  [name]

## 2021-03-09 NOTE — Op Note (Signed)
PODIATRY / FOOT AND ANKLE SURGERY OPERATIVE REPORT    SURGEON: Rosetta Posner, DPM  PRE-OPERATIVE DIAGNOSIS:  1.  Left foot hallux valgus 2.  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  Cyst, ganglion to the left second toe DIPJ  PATHOLOGY/SPECIMEN(S): Left second toe DIPJ cyst  INDICATIONS:   Kirsten Patterson is a 50 y.o. female who presents with painful bunion contracture to the left first metatarsal phalangeal joint as well as painful cystic formation and hammertoe contractures of the DIPJ of the left second toe.  Patient has exhausted conservative measures and presents today for surgical intervention consisting of left Austin/Akin bunionectomy with second hammertoe repair and cyst resection at the DIPJ.  All treatment options were discussed with the patient both conservative and surgical attempts at correction including potential risks and complications at this time patient is elected for surgical intervention described above.  No guarantees given.  Consent obtained prior to procedure.  DESCRIPTION: After obtaining full informed written consent, the patient was brought back to the operating room and placed supine upon the operating table.  A preoperative block was performed by anesthesia.  The patient received IV antibiotics prior to induction.  After obtaining adequate anesthesia, the patient was prepped and draped in the standard fashion.  An Esmarch bandage was used to exsanguinate the left lower extremity pneumatic ankle tourniquet was inflated.  Attention was then directed to the left forefoot where a linear longitudinal incision was made medial to the tendon of the extensor hallucis longus and while the  contour of the deformity.  The incision was deepened to the subcutaneous tissues utilizing both sharp and blunt dissection, care was taken to identify and retract all vital neurovascular structures all venous contributories were cauterized as necessary.  At this time a capsular and periosteal incision was made in a similar manner medial to the tendon of the EHL and involved the contour of the deformity.  The capsular and periosteal tissue was reflected medially and laterally thereby exposing the first metatarsal phalangeal joint and the base the proximal phalanx at the operative site.  There appeared to be a large dorsal exostosis and large medial eminence present which were both resected and passed off in the operative site.  A lateral release was then performed through the same incision retracting the extensor tendon medially and skin and subcutaneous tissue laterally.  Once the lateral release was performed attention was directed back to the first metatarsal head medially where an Austin distal first metatarsal osteotomy was performed with a slightly longer plantar arm the dorsal arm.  The capital fragment was shifted laterally approximately 3 to 4 mm and held into place with temporary fixation for a 2.5 cannulated Paragon 28 screw.  A 2.5 mm Paragon 28 screw by 24 mm was placed across the osteotomy site from dorsal to plantar with the appropriate orientation.  Excellent compression was noted, was placed with standard AO principles and techniques.  The medial overhang was then resected and passed off in the operative site and the area was smoothed to a normal contour.  Attention was then directed to the proximal phalanx base where a guidewire was placed from dorsal to plantar at the lateral cortex area at the proximal base/mid shaft of the proximal phalanx.  At this time a sagittal bone saw was then used to create an Akin osteotomy and the bone wedge was removed along with the temporary wire and the area was  feathered closed.  A 10 mm Akin staple from Paragon 28 was placed across the osteotomy site with excellent compression noted.  The surgical sites were flushed with copious amounts normal sterile saline.  C-arm imaging was used to verify correct position which appeared to be excellent both the Akin osteotomy site and Austin osteotomy site.  Compression appeared to be excellent across the osteotomy sites with screw/staple of the appropriate length and size.  Reduction was noted of the first intermetatarsal space as well as hallux abductus angle and hallux interphalangeus angle.  The sesamoids appear to be completely underneath the first metatarsal head.  The capsular and periosteal tissues were then reapproximated well coapted with 3-0 Vicryl, the subcutaneous tissue was reapproximated coapted with 4-0 Vicryl, the skin was then reapproximated well coapted and subcuticular stitch with 4-0 Monocryl.  Mastisol and Steri-Strips were applied.  Attention was then directed to the left second toe at the level of the DIPJ where a elliptical incision was made around the joint area ellipsing the cyst that was present in its entirety.  It appeared to be coming from the DIPJ.  A portion of the extensor tendon was also resected with this and passed off in the operative site and sent off for pathology.  At this time the extensor tendon was incised as well as the capsule at the DIPJ and the extensor tendon was reflected slightly proximally off the proximal phalanx head and the collateral ligaments were released.  A sagittal bone saw was used to resect the head of the middle phalanx and curette and rongeur was used to resect the articular cartilage from the base of the distal phalanx.  No remaining cystic debris appeared to be apparent.  The surgical site was flushed with copious amounts normal sterile saline.  The extensor tendon was then held in an opposed position and sutured back together at the level of the PIPJ along with the  capsular tissue with 3-0 Vicryl.  The skin was then reapproximated well coapted with 4-0 nylon.  The pneumatic ankle tourniquet was deflated and a prompt hyperemic response was noted all digits left foot.  A postoperative dressing was then applied consisting of Xeroform to the incision lines followed by 4 x 4 gauze, Kling, Kerlix, Ace wrap.  Patient was placed in a tall cam boot.  Patient tolerated the procedure and anesthesia well was transferred to recovery room vital signs stable vascular status intact all toes left foot.  Following a period of postoperative monitoring the patient will be discharged home with the appropriate orders and follow-up instructions, medications sent to pharmacy.  Patient to follow-up in 1 week.  COMPLICATIONS: none  CONDITION: Good, stable  Rosetta Posner, DPM

## 2021-03-09 NOTE — Transfer of Care (Signed)
Immediate Anesthesia Transfer of Care Note  Patient: Kirsten Patterson  Procedure(s) Performed: HAMMER TOE CORRECTION - 2ND TOE (Left: Toe) BUNIONECTOMY - DOUBLE OSTEOTOMY (Left: Toe)  Patient Location: PACU  Anesthesia Type: General, Regional  Level of Consciousness: awake, alert  and patient cooperative  Airway and Oxygen Therapy: Patient Spontanous Breathing and Patient connected to supplemental oxygen  Post-op Assessment: Post-op Vital signs reviewed, Patient's Cardiovascular Status Stable, Respiratory Function Stable, Patent Airway and No signs of Nausea or vomiting  Post-op Vital Signs: Reviewed and stable  Complications: No notable events documented.

## 2021-03-10 ENCOUNTER — Encounter: Payer: Self-pay | Admitting: Podiatry

## 2021-03-13 LAB — SURGICAL PATHOLOGY

## 2021-03-15 DIAGNOSIS — Z09 Encounter for follow-up examination after completed treatment for conditions other than malignant neoplasm: Secondary | ICD-10-CM | POA: Diagnosis not present

## 2021-03-15 DIAGNOSIS — M79672 Pain in left foot: Secondary | ICD-10-CM | POA: Diagnosis not present

## 2021-03-15 DIAGNOSIS — M2012 Hallux valgus (acquired), left foot: Secondary | ICD-10-CM | POA: Diagnosis not present

## 2021-04-10 DIAGNOSIS — M2012 Hallux valgus (acquired), left foot: Secondary | ICD-10-CM | POA: Diagnosis not present

## 2021-04-10 DIAGNOSIS — M79672 Pain in left foot: Secondary | ICD-10-CM | POA: Diagnosis not present

## 2021-05-03 DIAGNOSIS — J310 Chronic rhinitis: Secondary | ICD-10-CM | POA: Diagnosis not present

## 2021-05-12 DIAGNOSIS — M2012 Hallux valgus (acquired), left foot: Secondary | ICD-10-CM | POA: Diagnosis not present

## 2021-05-12 DIAGNOSIS — M79672 Pain in left foot: Secondary | ICD-10-CM | POA: Diagnosis not present

## 2021-05-29 IMAGING — MG DIGITAL SCREENING BILAT W/ TOMO W/ CAD
8 series · 9 of 24 positions shown · non-contrast
Comparison: Previous exam(s).

CLINICAL DATA: Screening.

EXAM:
DIGITAL SCREENING BILATERAL MAMMOGRAM WITH TOMO AND CAD

[L MLO synth-2D]
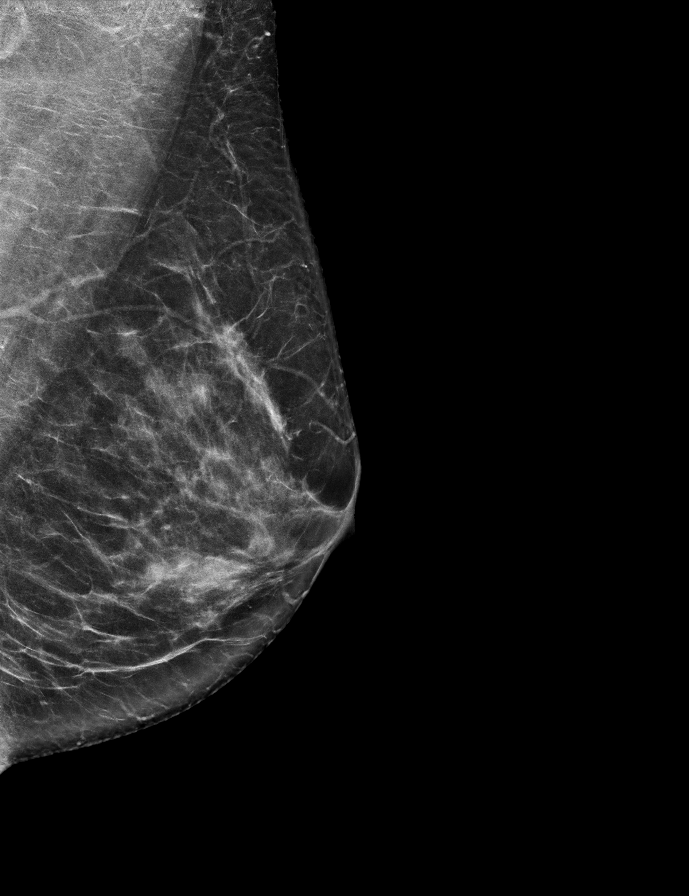

[R MLO synth-2D]
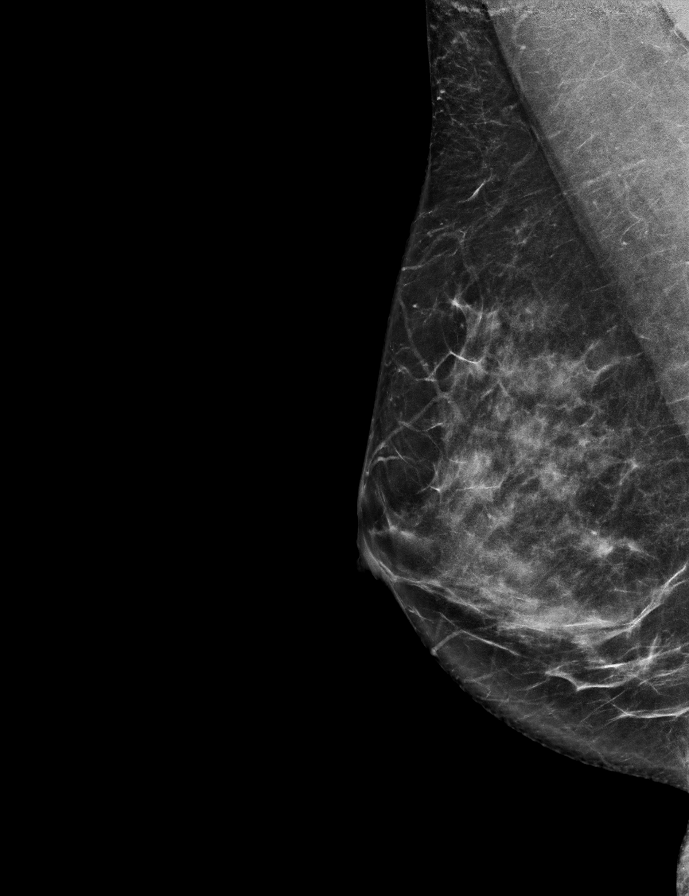

[L CC synth-2D]
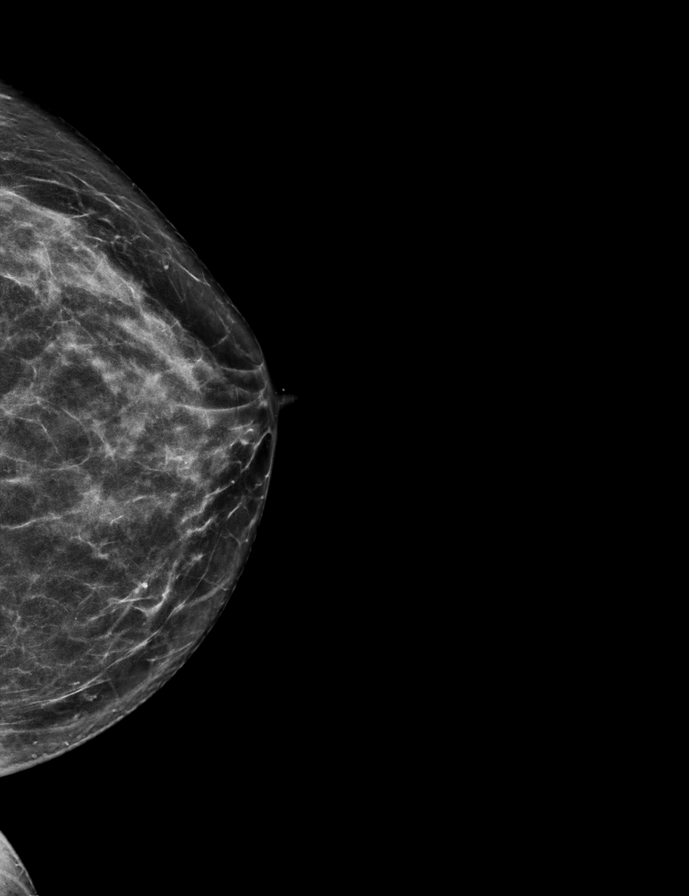

[R CC synth-2D]
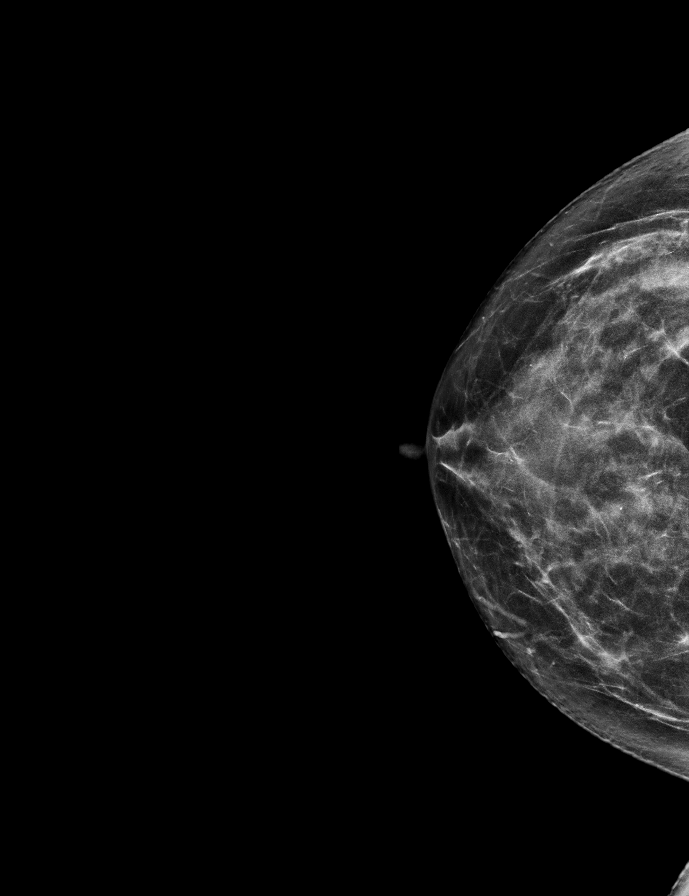

[R CC tomo · 2 of 63 frames shown]
[frame 21/63]
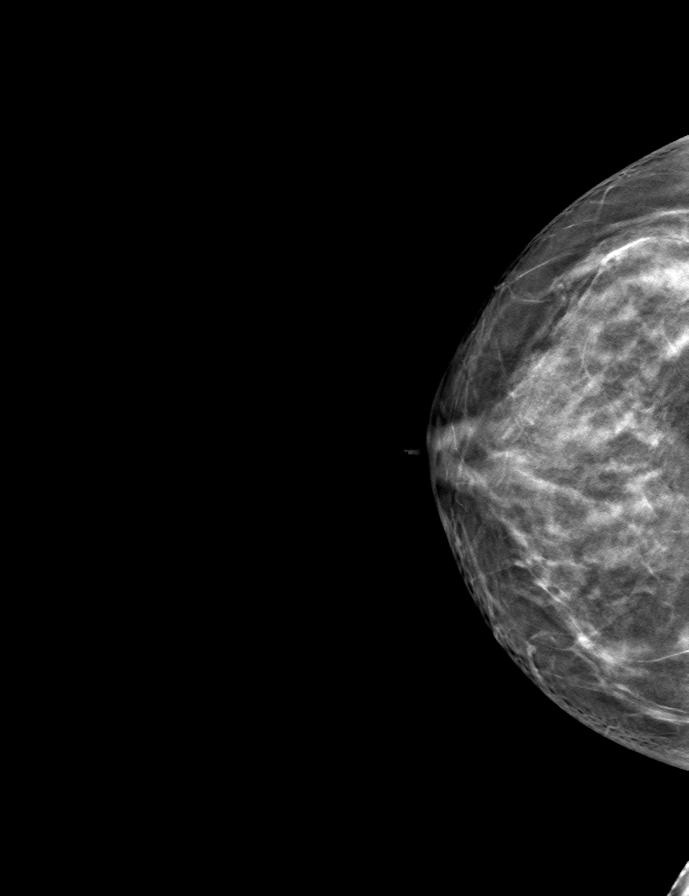
[frame 32/63]
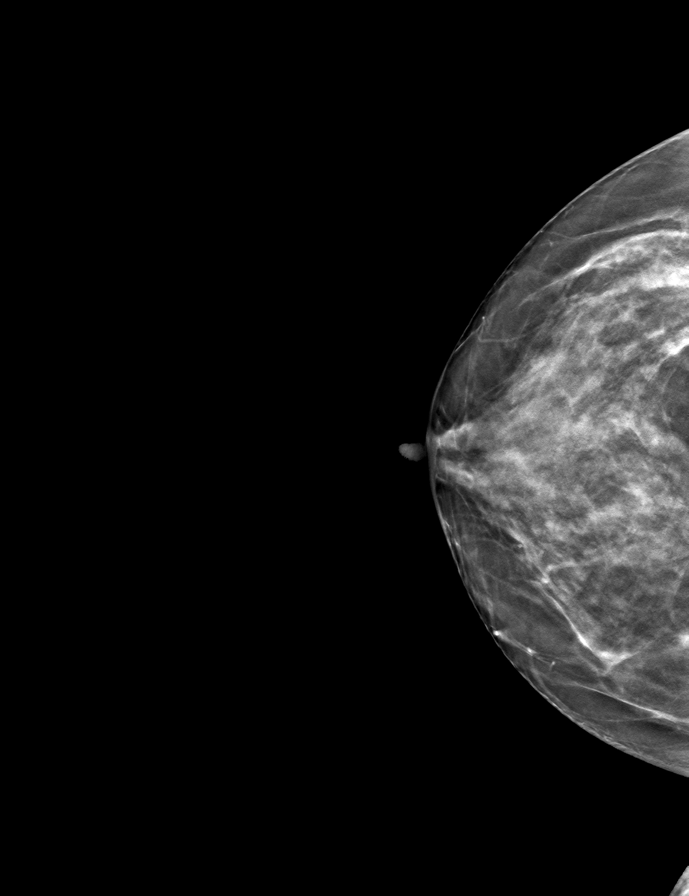

[L MLO tomo · tomo slice 36/71.0]
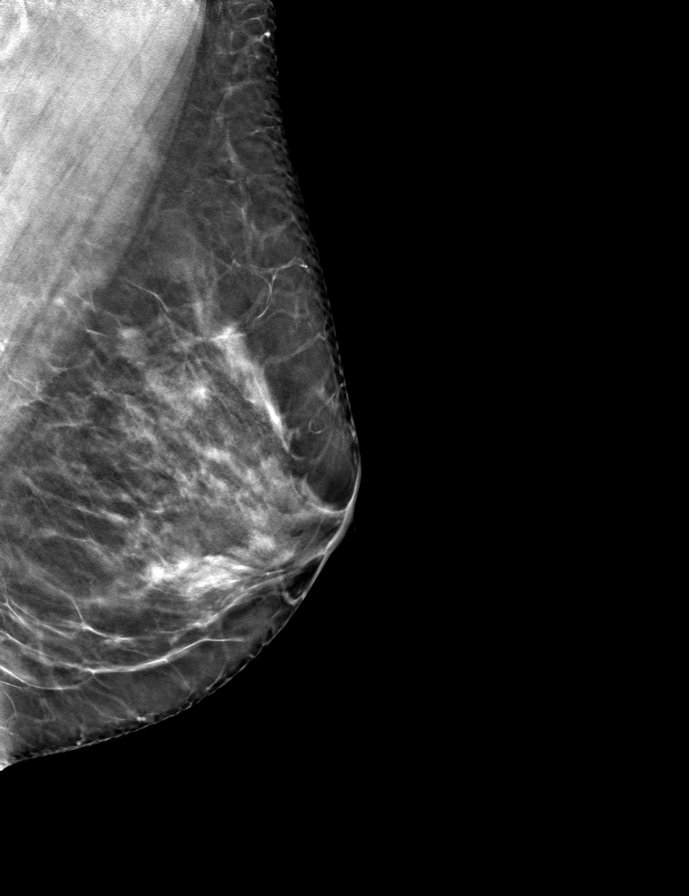

[R MLO tomo · tomo slice 35/69.0]
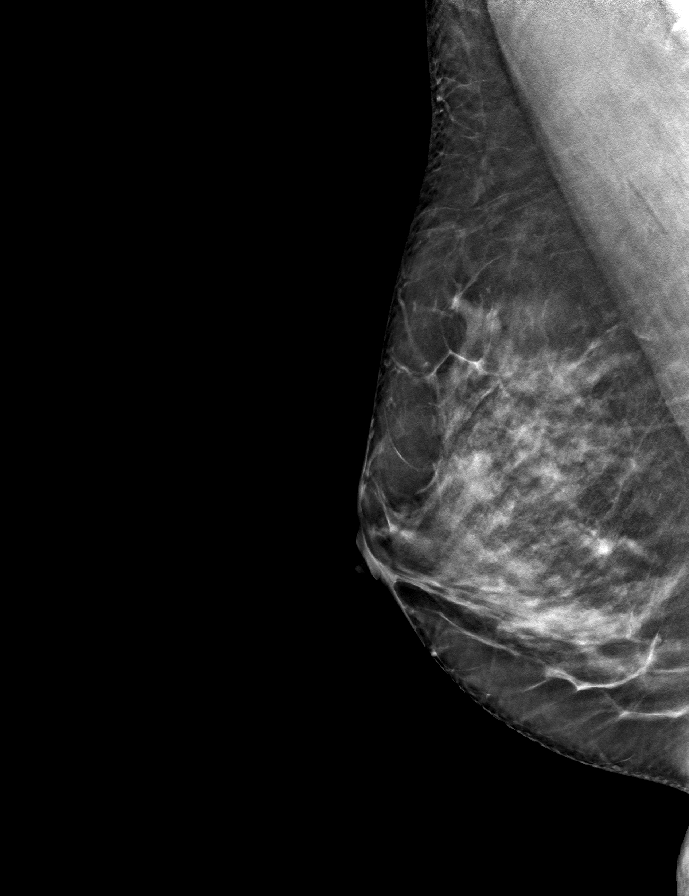

[L CC tomo · tomo slice 33/64.0]
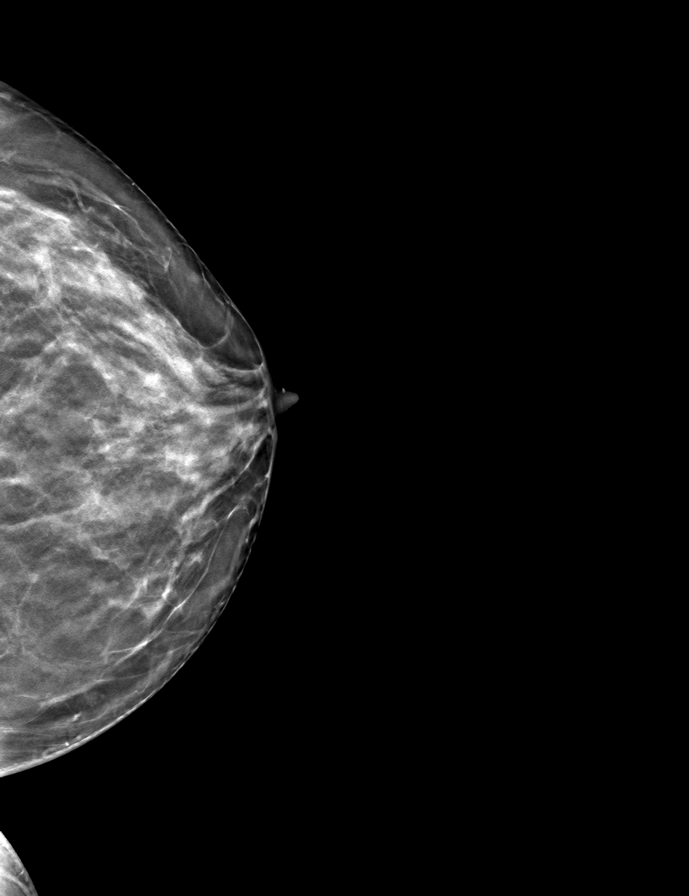

[9 of 24 positions shown; findings below may reference images not displayed]

ACR Breast Density Category c: The breast tissue is heterogeneously
dense, which may obscure small masses.
FINDINGS: There are no findings suspicious for malignancy. Images were
processed with CAD.
IMPRESSION: No mammographic evidence of malignancy. A result letter of this
screening mammogram will be mailed directly to the patient.

RECOMMENDATION:
Screening mammogram in one year. (Code:FT-U-LHB)

BI-RADS CATEGORY  1: Negative.

## 2021-07-14 DIAGNOSIS — M79672 Pain in left foot: Secondary | ICD-10-CM | POA: Diagnosis not present

## 2021-07-16 ENCOUNTER — Other Ambulatory Visit: Payer: Self-pay | Admitting: Obstetrics & Gynecology

## 2021-07-16 DIAGNOSIS — Z780 Asymptomatic menopausal state: Secondary | ICD-10-CM

## 2021-10-10 DIAGNOSIS — J320 Chronic maxillary sinusitis: Secondary | ICD-10-CM | POA: Diagnosis not present

## 2021-11-20 ENCOUNTER — Other Ambulatory Visit: Payer: Self-pay | Admitting: Internal Medicine

## 2021-11-20 DIAGNOSIS — Z1231 Encounter for screening mammogram for malignant neoplasm of breast: Secondary | ICD-10-CM

## 2021-11-20 DIAGNOSIS — J4520 Mild intermittent asthma, uncomplicated: Secondary | ICD-10-CM | POA: Diagnosis not present

## 2021-11-20 DIAGNOSIS — Z9109 Other allergy status, other than to drugs and biological substances: Secondary | ICD-10-CM | POA: Diagnosis not present

## 2021-11-20 DIAGNOSIS — R002 Palpitations: Secondary | ICD-10-CM | POA: Diagnosis not present

## 2021-11-20 DIAGNOSIS — Z0000 Encounter for general adult medical examination without abnormal findings: Secondary | ICD-10-CM | POA: Diagnosis not present

## 2021-12-12 DIAGNOSIS — Z13220 Encounter for screening for lipoid disorders: Secondary | ICD-10-CM | POA: Diagnosis not present

## 2021-12-12 DIAGNOSIS — Z79899 Other long term (current) drug therapy: Secondary | ICD-10-CM | POA: Diagnosis not present

## 2021-12-14 ENCOUNTER — Ambulatory Visit
Admission: RE | Admit: 2021-12-14 | Discharge: 2021-12-14 | Disposition: A | Discharge status: Home / Self Care | Payer: BC Managed Care – PPO | Source: Ambulatory Visit | Attending: Internal Medicine | Admitting: Internal Medicine

## 2021-12-14 DIAGNOSIS — Z1231 Encounter for screening mammogram for malignant neoplasm of breast: Secondary | ICD-10-CM | POA: Diagnosis not present

## 2022-02-18 NOTE — Progress Notes (Signed)
PCP: Marguarite Arbour, MD   Chief Complaint  Patient presents with   Gynecologic Exam    Hot flashes, fatigue, irritability, no libido    HPI:      [name] is a 51 y.o. [phone] whose LMP was No LMP recorded. Patient is postmenopausal., presents today for her annual examination.  Her menses are absent due to menopause. No PMB. She does continue to have significant vasomotor sx, particularly night sweats; only minimally improved with activella start last yr. Normal thyroid labs 9/23.  Sex activity: single partner, contraception - post menopausal status. She does have vaginal dryness/discomfort; some improvement with lubricants. Also has difficulty achieving orgasm which is frustrating for pt and husband.  Last Pap: 05/16/20  Results were: no abnormalities /neg HPV DNA.   Last mammogram: 12/14/21  Results were: normal--routine follow-up in 12 months There is a FH of breast cancer in her mat grt aunt, genetic testing not indicated. There is no FH of ovarian cancer. The patient does do self-breast exams.  Colonoscopy: 11/21 with Dr. Servando Snare; Repeat due after 5 years.   Tobacco use: The patient denies current or previous tobacco use. Alcohol use: QOD No drug use Exercise: not active  She does get adequate calcium but not Vitamin D in her diet.  Labs with PCP.   Patient Active Problem List   Diagnosis Date Noted   Menopause 07/19/2020   Vasomotor flushing 05/16/2020   Special screening for malignant neoplasm of intestine    Chronic maxillary sinusitis 10/06/2019   Hot flash, menopausal 06/28/2017   Asthma 07/07/2013   Back pain 07/07/2013   Recurrent sinusitis 07/07/2013   Environmental allergies 07/07/2013   Fatigue 07/07/2013   History of mitral valve prolapse 07/07/2013   Paresthesias in right hand 07/07/2013    Past Surgical History:  Procedure Laterality Date   ABLATION     BUNIONECTOMY Left 03/09/2021   Procedure: BUNIONECTOMY - DOUBLE OSTEOTOMY;  Surgeon:  Rosetta Posner, DPM;  Location[facility];  Service: Podiatry;  Laterality: Left;   COLONOSCOPY WITH PROPOFOL N/A 01/29/2020   Procedure: COLONOSCOPY WITH PROPOFOL;  Surgeon: Midge Minium, MD;  Location: [facility];  Service: Endoscopy;  Laterality: N/A;  screening colonoscopy z12.11    HAMMER TOE SURGERY Left 03/09/2021   Procedure: HAMMER TOE CORRECTION - 2ND TOE;  Surgeon: Rosetta Posner, DPM;  Location: [facility];  Service: Podiatry;  Laterality: Left;  Special needs/equipment- Anesthesia- Choice, pre op pop/saph block   SINUS SURGERY WITH INSTATRAK  11/13/2019   x 6    Family History  Problem Relation Age of Onset   Lung cancer Mother    Diabetes Mother    Heart failure Mother    CAD Mother    Heart disease Father    Breast cancer Maternal Aunt        not sure age    Social History   Socioeconomic History   Marital status: Married    Spouse name: Not on file   Number of children: Not on file   Years of education: Not on file   Highest education level: Not on file  Occupational History   Not on file  Tobacco Use   Smoking status: Never   Smokeless tobacco: Never  Vaping Use   Vaping Use: Never used  Substance and Sexual Activity   Alcohol use: Yes    Alcohol/week: 7.0 standard drinks of alcohol    Types: 7 Cans of beer per week  Drug use: Not Currently   Sexual activity: Yes    Birth control/protection: Post-menopausal  Other Topics Concern   Not on file  Social History Narrative   Not on file   Social Determinants of Health   Financial Resource Strain: Not on file  Food Insecurity: Not on file  Transportation Needs: Not on file  Physical Activity: Not on file  Stress: Not on file  Social Connections: Not on file  Intimate Partner Violence: Not on file     Current Outpatient Medications:    albuterol (VENTOLIN HFA) 108 (90 Base) MCG/ACT inhaler, Inhale into the lungs every 6 (six) hours as needed for wheezing or shortness of  breath., Disp: , Rfl:    cetirizine (ZYRTEC) 10 MG tablet, Take 10 mg by mouth., Disp: , Rfl:    doxycycline (VIBRA-TABS) 100 MG tablet, Take by mouth., Disp: , Rfl:    [START ON 02/22/2022] estradiol (VIVELLE-DOT) 0.05 MG/24HR patch, Place 1 patch (0.05 mg total) onto the skin 2 (two) times a week., Disp: 24 patch, Rfl: 0   fluticasone (FLONASE) 50 MCG/ACT nasal spray, Place into both nostrils daily., Disp: , Rfl:    omeprazole (PRILOSEC) 40 MG capsule, Take by mouth., Disp: , Rfl:    progesterone (PROMETRIUM) 100 MG capsule, Take 1 cap nightly, 6 nights on, 1 night off, Disp: 90 capsule, Rfl: 0   propranolol ER (INDERAL LA) 60 MG 24 hr capsule, Take 60 mg by mouth daily., Disp: , Rfl:    valACYclovir (VALTREX) 1000 MG tablet, Take by mouth., Disp: , Rfl:      ROS:  Review of Systems  Constitutional:  Positive for fatigue. Negative for fever and unexpected weight change.  Respiratory:  Negative for cough, shortness of breath and wheezing.   Cardiovascular:  Negative for chest pain, palpitations and leg swelling.  Gastrointestinal:  Negative for blood in stool, constipation, diarrhea, nausea and vomiting.  Endocrine: Negative for cold intolerance, heat intolerance and polyuria.  Genitourinary:  Positive for dyspareunia. Negative for dysuria, flank pain, frequency, genital sores, hematuria, menstrual problem, pelvic pain, urgency, vaginal bleeding, vaginal discharge and vaginal pain.  Musculoskeletal:  Positive for arthralgias. Negative for back pain, joint swelling and myalgias.  Skin:  Negative for rash.  Neurological:  Positive for headaches. Negative for dizziness, syncope, light-headedness and numbness.  Hematological:  Negative for adenopathy.  Psychiatric/Behavioral:  Positive for agitation. Negative for confusion, sleep disturbance and suicidal ideas. The patient is not nervous/anxious.    BREAST: No symptoms    Objective: BP 120/70   Ht 5\' 5"  (1.651 m)   Wt 166 lb (75.3 kg)    BMI 27.62 kg/m    Physical Exam Constitutional:      Appearance: She is well-developed.  Genitourinary:     Vulva normal.     Right Labia: No rash, tenderness or lesions.    Left Labia: No tenderness, lesions or rash.    No vaginal discharge, erythema or tenderness.      Right Adnexa: not tender and no mass present.    Left Adnexa: not tender and no mass present.    No cervical friability or polyp.     Uterus is not enlarged or tender.  Breasts:    Right: No mass, nipple discharge, skin change or tenderness.     Left: No mass, nipple discharge, skin change or tenderness.  Neck:     Thyroid: No thyromegaly.  Cardiovascular:     Rate and Rhythm: Normal rate and regular rhythm.  Heart sounds: Normal heart sounds. No murmur heard. Pulmonary:     Effort: Pulmonary effort is normal.     Breath sounds: Normal breath sounds.  Abdominal:     Palpations: Abdomen is soft.     Tenderness: There is no abdominal tenderness. There is no guarding or rebound.  Musculoskeletal:        General: Normal range of motion.     Cervical back: Normal range of motion.  Lymphadenopathy:     Cervical: No cervical adenopathy.  Neurological:     General: No focal deficit present.     Mental Status: She is alert and oriented to person, place, and time.     Cranial Nerves: No cranial nerve deficit.  Skin:    General: Skin is warm and dry.  Psychiatric:        Mood and Affect: Mood normal.        Behavior: Behavior normal.        Thought Content: Thought content normal.        Judgment: Judgment normal.  Vitals reviewed.     Assessment/Plan:  Encounter for annual routine gynecological examination  Encounter for screening mammogram for malignant neoplasm of breast; pt current on mammo  Vasomotor symptoms due to menopause - Plan: estradiol (VIVELLE-DOT) 0.05 MG/24HR patch, progesterone (PROMETRIUM) 100 MG capsule; sx not resolved with activella highest dose. Will try ERT patch and oral  prog instead for absorption purposes to see if sx improve. Pt to f/u in 3 months via MyChart msg with sx. Can increase ERT prn. Increase exercise for sx.   Hormone replacement therapy (HRT) - Plan: estradiol (VIVELLE-DOT) 0.05 MG/24HR patch, progesterone (PROMETRIUM) 100 MG capsule  Dyspareunia in female--adjusting HRT, add coconut oil. Add DHEA 5 mg QD. F/u prn.    Meds ordered this encounter  Medications   estradiol (VIVELLE-DOT) 0.05 MG/24HR patch    Sig: Place 1 patch (0.05 mg total) onto the skin 2 (two) times a week.    Dispense:  24 patch    Refill:  0    Order Specific Question:   Supervising Provider    Answer:   Hildred Laser [[id number]]   progesterone (PROMETRIUM) 100 MG capsule    Sig: Take 1 cap nightly, 6 nights on, 1 night off    Dispense:  90 capsule    Refill:  0    Order Specific Question:   Supervising Provider    Answer:   Waymon Budge            GYN counsel breast self exam, mammography screening, menopause, adequate intake of calcium and vitamin D, diet and exercise    F/U  Return in about 1 year (around 02/21/2023).  Kendall Justo B. Macgregor Aeschliman, PA-C 02/20/2022 2:35 PM

## 2022-02-20 ENCOUNTER — Encounter: Payer: Self-pay | Admitting: Obstetrics and Gynecology

## 2022-02-20 ENCOUNTER — Ambulatory Visit (INDEPENDENT_AMBULATORY_CARE_PROVIDER_SITE_OTHER): Payer: BC Managed Care – PPO | Admitting: Obstetrics and Gynecology

## 2022-02-20 VITALS — BP 120/70 | Ht 65.0 in | Wt 166.0 lb

## 2022-02-20 DIAGNOSIS — N941 Unspecified dyspareunia: Secondary | ICD-10-CM

## 2022-02-20 DIAGNOSIS — Z1231 Encounter for screening mammogram for malignant neoplasm of breast: Secondary | ICD-10-CM

## 2022-02-20 DIAGNOSIS — Z01419 Encounter for gynecological examination (general) (routine) without abnormal findings: Secondary | ICD-10-CM | POA: Diagnosis not present

## 2022-02-20 DIAGNOSIS — Z79890 Hormone replacement therapy (postmenopausal): Secondary | ICD-10-CM | POA: Diagnosis not present

## 2022-02-20 DIAGNOSIS — N951 Menopausal and female climacteric states: Secondary | ICD-10-CM

## 2022-02-20 DIAGNOSIS — Z1211 Encounter for screening for malignant neoplasm of colon: Secondary | ICD-10-CM

## 2022-02-20 MED ORDER — PROGESTERONE MICRONIZED 100 MG PO CAPS: ORAL_CAPSULE | ORAL | 0 refills | Status: DC

## 2022-02-20 MED ORDER — ESTRADIOL 0.05 MG/24HR TD PTTW: 1.0000 | MEDICATED_PATCH | TRANSDERMAL | 0 refills | Status: DC

## 2022-02-20 NOTE — Patient Instructions (Addendum)
I value your feedback and you entrusting us with your care. If you get a [facility] patient survey, I would appreciate you taking the time to let us know about your experience today. Thank you! ? ? ?

## 2022-04-17 DIAGNOSIS — J320 Chronic maxillary sinusitis: Secondary | ICD-10-CM | POA: Diagnosis not present

## 2022-05-15 ENCOUNTER — Other Ambulatory Visit: Payer: Self-pay | Admitting: Obstetrics and Gynecology

## 2022-05-15 ENCOUNTER — Encounter: Payer: Self-pay | Admitting: Obstetrics and Gynecology

## 2022-05-15 DIAGNOSIS — Z79890 Hormone replacement therapy (postmenopausal): Secondary | ICD-10-CM

## 2022-05-15 DIAGNOSIS — N951 Menopausal and female climacteric states: Secondary | ICD-10-CM

## 2022-05-15 MED ORDER — PROGESTERONE MICRONIZED 100 MG PO CAPS: ORAL_CAPSULE | ORAL | 2 refills | Status: DC

## 2022-05-15 NOTE — Progress Notes (Signed)
Rx RF estradiol and prom for VS sx. Doing well

## 2022-06-23 ENCOUNTER — Encounter: Payer: Self-pay | Admitting: Obstetrics and Gynecology

## 2022-10-17 DIAGNOSIS — R399 Unspecified symptoms and signs involving the genitourinary system: Secondary | ICD-10-CM | POA: Diagnosis not present

## 2022-11-22 DIAGNOSIS — Z0000 Encounter for general adult medical examination without abnormal findings: Secondary | ICD-10-CM | POA: Diagnosis not present

## 2022-11-22 DIAGNOSIS — Z79899 Other long term (current) drug therapy: Secondary | ICD-10-CM | POA: Diagnosis not present

## 2022-11-22 DIAGNOSIS — R002 Palpitations: Secondary | ICD-10-CM | POA: Diagnosis not present

## 2022-11-22 DIAGNOSIS — E782 Mixed hyperlipidemia: Secondary | ICD-10-CM | POA: Diagnosis not present

## 2022-11-22 DIAGNOSIS — J4520 Mild intermittent asthma, uncomplicated: Secondary | ICD-10-CM | POA: Diagnosis not present

## 2022-11-23 ENCOUNTER — Other Ambulatory Visit: Payer: Self-pay | Admitting: Internal Medicine

## 2022-11-23 DIAGNOSIS — Z1231 Encounter for screening mammogram for malignant neoplasm of breast: Secondary | ICD-10-CM

## 2023-01-16 ENCOUNTER — Telehealth: Payer: Self-pay | Admitting: Obstetrics and Gynecology

## 2023-01-16 NOTE — Telephone Encounter (Signed)
Left message for patient to call office back to schedule annual appt. Patient is due after 02/21/23

## 2023-01-17 ENCOUNTER — Ambulatory Visit
Admission: RE | Admit: 2023-01-17 | Discharge: 2023-01-17 | Disposition: A | Discharge status: Home / Self Care | Payer: BC Managed Care – PPO | Source: Ambulatory Visit | Attending: Internal Medicine | Admitting: Internal Medicine

## 2023-01-17 DIAGNOSIS — Z1231 Encounter for screening mammogram for malignant neoplasm of breast: Secondary | ICD-10-CM | POA: Insufficient documentation

## 2023-01-21 ENCOUNTER — Other Ambulatory Visit: Payer: Self-pay | Admitting: Internal Medicine

## 2023-01-21 DIAGNOSIS — N6489 Other specified disorders of breast: Secondary | ICD-10-CM

## 2023-01-21 DIAGNOSIS — R928 Other abnormal and inconclusive findings on diagnostic imaging of breast: Secondary | ICD-10-CM

## 2023-01-24 ENCOUNTER — Other Ambulatory Visit: Payer: Self-pay | Admitting: Internal Medicine

## 2023-01-24 ENCOUNTER — Encounter: Payer: Self-pay | Admitting: Internal Medicine

## 2023-01-24 DIAGNOSIS — R928 Other abnormal and inconclusive findings on diagnostic imaging of breast: Secondary | ICD-10-CM

## 2023-01-24 DIAGNOSIS — N6489 Other specified disorders of breast: Secondary | ICD-10-CM

## 2023-01-30 ENCOUNTER — Ambulatory Visit
Admission: RE | Admit: 2023-01-30 | Discharge: 2023-01-30 | Disposition: A | Discharge status: Home / Self Care | Payer: BC Managed Care – PPO | Source: Ambulatory Visit | Attending: Internal Medicine | Admitting: Internal Medicine

## 2023-01-30 DIAGNOSIS — R928 Other abnormal and inconclusive findings on diagnostic imaging of breast: Secondary | ICD-10-CM | POA: Insufficient documentation

## 2023-01-30 DIAGNOSIS — R92322 Mammographic fibroglandular density, left breast: Secondary | ICD-10-CM | POA: Diagnosis not present

## 2023-01-31 ENCOUNTER — Other Ambulatory Visit: Payer: Self-pay | Admitting: Internal Medicine

## 2023-01-31 DIAGNOSIS — R928 Other abnormal and inconclusive findings on diagnostic imaging of breast: Secondary | ICD-10-CM

## 2023-01-31 DIAGNOSIS — N6489 Other specified disorders of breast: Secondary | ICD-10-CM

## 2023-02-06 ENCOUNTER — Ambulatory Visit
Admission: RE | Admit: 2023-02-06 | Discharge: 2023-02-06 | Disposition: A | Discharge status: Home / Self Care | Payer: BC Managed Care – PPO | Source: Ambulatory Visit | Attending: Internal Medicine | Admitting: Internal Medicine

## 2023-02-06 DIAGNOSIS — N6022 Fibroadenosis of left breast: Secondary | ICD-10-CM | POA: Insufficient documentation

## 2023-02-06 DIAGNOSIS — N6489 Other specified disorders of breast: Secondary | ICD-10-CM | POA: Insufficient documentation

## 2023-02-06 DIAGNOSIS — R928 Other abnormal and inconclusive findings on diagnostic imaging of breast: Secondary | ICD-10-CM | POA: Insufficient documentation

## 2023-02-06 HISTORY — PX: BREAST BIOPSY: SHX20

## 2023-02-06 MED ORDER — LIDOCAINE 1 % OPTIME INJ - NO CHARGE
5.0000 mL | Freq: Once | INTRAMUSCULAR | Status: AC
Start: 2023-02-06 — End: 2023-02-06
  Administered 2023-02-06: 5 mL
  Filled 2023-02-06: qty 6

## 2023-02-06 MED ORDER — LIDOCAINE-EPINEPHRINE 1 %-1:100000 IJ SOLN
20.0000 mL | Freq: Once | INTRAMUSCULAR | Status: AC
  Administered 2023-02-06: 20 mL
  Filled 2023-02-06: qty 20

## 2023-02-07 LAB — SURGICAL PATHOLOGY

## 2023-03-14 NOTE — Progress Notes (Signed)
PCP: Marguarite Arbour, MD   Chief Complaint  Patient presents with   Gynecologic Exam    Vaginal itching/dryness    HPI:      [name] is a 52 y.o. [phone] whose LMP was No LMP recorded. Patient is postmenopausal., presents today for her annual examination.  Her menses are absent due to menopause. No PMB. Improved VS sx with vivelle dot 0.05 mg and prometrium 100 mg at bedtime, didn't have relief with highest activella dose. Still has some tolerable hot flashes but night sweats significantly improved.   Sex activity: single partner, contraception - post menopausal status. She does have vaginal dryness/discomfort/pain; minimal improvement with lubricants/coconut oil. Has area on RT labia that itches/feels dry.   Last Pap: 05/16/20  Results were: no abnormalities /neg HPV DNA.   Last mammogram: 01/30/23  Results were: cat 4 LT breast with neg bx 11/24, f/u dx mammo and u/s due in 6 months.  There is a FH of breast cancer in her mat grt aunt, genetic testing not indicated. There is no FH of ovarian cancer. The patient does do self-breast exams.  Colonoscopy: 11/21 with Dr. Servando Snare; Repeat due after 5 years.   Tobacco use: The patient denies current or previous tobacco use. Alcohol use: wknds No drug use Exercise: min active  She does get adequate calcium but not Vitamin D in her diet.  Labs with PCP.   Patient Active Problem List   Diagnosis Date Noted   Menopause 07/19/2020   Vasomotor flushing 05/16/2020   Special screening for malignant neoplasm of intestine    Chronic maxillary sinusitis 10/06/2019   Vasomotor symptoms due to menopause 06/28/2017   Asthma 07/07/2013   Back pain 07/07/2013   Recurrent sinusitis 07/07/2013   Environmental allergies 07/07/2013   Fatigue 07/07/2013   History of mitral valve prolapse 07/07/2013   Paresthesias in right hand 07/07/2013    Past Surgical History:  Procedure Laterality Date   ABLATION     BREAST BIOPSY Left  02/06/2023   Stereo bx, asymmetry, COIL clip-path pending   BREAST BIOPSY Left 02/06/2023   MM LT BREAST BX W LOC DEV 1ST LESION IMAGE BX SPEC STEREO GUIDE 02/06/2023 [facility]   BUNIONECTOMY Left 03/09/2021   Procedure: BUNIONECTOMY - DOUBLE OSTEOTOMY;  Surgeon: Rosetta Posner, DPM;  Location: [facility];  Service: Podiatry;  Laterality: Left;   COLONOSCOPY WITH PROPOFOL N/A 01/29/2020   Procedure: COLONOSCOPY WITH PROPOFOL;  Surgeon: Midge Minium, MD;  Location[facility];  Service: Endoscopy;  Laterality: N/A;  screening colonoscopy z12.11    HAMMER TOE SURGERY Left 03/09/2021   Procedure: HAMMER TOE CORRECTION - 2ND TOE;  Surgeon: Rosetta Posner, DPM;  Location: [facility];  Service: Podiatry;  Laterality: Left;  Special needs/equipment- Anesthesia- Choice, pre op pop/saph block   SINUS SURGERY WITH INSTATRAK  11/13/2019   x 6    Family History  Problem Relation Age of Onset   Lung cancer Mother    Diabetes Mother    Heart failure Mother    CAD Mother    Heart disease Father    Breast cancer Maternal Aunt        not sure age    Social History   Socioeconomic History   Marital status: Married    Spouse name: Not on file   Number of children: Not on file   Years of education: Not on file   Highest education level: Not on file  Occupational History  Not on file  Tobacco Use   Smoking status: Never   Smokeless tobacco: Never  Vaping Use   Vaping status: Never Used  Substance and Sexual Activity   Alcohol use: Yes    Alcohol/week: 7.0 standard drinks of alcohol    Types: 7 Cans of beer per week   Drug use: Not Currently   Sexual activity: Yes    Birth control/protection: Post-menopausal  Other Topics Concern   Not on file  Social History Narrative   Not on file   Social Drivers of Health   Financial Resource Strain: Low Risk  (11/22/2022)   Received from [facility]   Overall Financial Resource Strain (CARDIA)     Difficulty of Paying Living Expenses: Not hard at all  Food Insecurity: No Food Insecurity (11/22/2022)   [facility]   Hunger Vital Sign    Worried About Running Out of Food in the Last Year: Never true    Ran Out of Food in the Last Year: Never true  Transportation Needs: No Transportation Needs (11/22/2022)   Received from [facility] - Transportation    In the past 12 months, has lack of transportation kept you from medical appointments or from getting medications?: No    Lack of Transportation (Non-Medical): No  Physical Activity: Not on file  Stress: Not on file  Social Connections: Not on file  Intimate Partner Violence: Not on file     Current Outpatient Medications:    albuterol (VENTOLIN HFA) 108 (90 Base) MCG/ACT inhaler, Inhale into the lungs every 6 (six) hours as needed for wheezing or shortness of breath., Disp: , Rfl:    cetirizine (ZYRTEC) 10 MG tablet, Take 10 mg by mouth., Disp: , Rfl:    cyclobenzaprine (FLEXERIL) 10 MG tablet, Take 10 mg by mouth at bedtime., Disp: , Rfl:    estradiol (ESTRACE VAGINAL) 0.1 MG/GM vaginal cream, Insert 1 g vaginally nightly for 1 week, then once wkly as maintenance, Disp: 42.5 g, Rfl: 1   fluticasone (FLONASE) 50 MCG/ACT nasal spray, Place into both nostrils daily., Disp: , Rfl:    omeprazole (PRILOSEC) 40 MG capsule, Take by mouth., Disp: , Rfl:    promethazine (PHENERGAN) 25 MG tablet, Take by mouth., Disp: , Rfl:    propranolol ER (INDERAL LA) 60 MG 24 hr capsule, Take 60 mg by mouth daily., Disp: , Rfl:    valACYclovir (VALTREX) 1000 MG tablet, Take by mouth., Disp: , Rfl:    [START ON 03/18/2023] estradiol (VIVELLE-DOT) 0.05 MG/24HR patch, Place 1 patch (0.05 mg total) onto the skin 2 (two) times a week., Disp: 24 patch, Rfl: 3   progesterone (PROMETRIUM) 100 MG capsule, Take 1 cap nightly, 6 nights on, 1 night off, Disp: 90 capsule, Rfl: 3     ROS:  Review of  Systems  Constitutional:  Positive for fatigue. Negative for fever and unexpected weight change.  Respiratory:  Negative for cough, shortness of breath and wheezing.   Cardiovascular:  Negative for chest pain, palpitations and leg swelling.  Gastrointestinal:  Negative for blood in stool, constipation, diarrhea, nausea and vomiting.  Endocrine: Negative for cold intolerance, heat intolerance and polyuria.  Genitourinary:  Positive for dyspareunia. Negative for dysuria, flank pain, frequency, genital sores, hematuria, menstrual problem, pelvic pain, urgency, vaginal bleeding, vaginal discharge and vaginal pain.  Musculoskeletal:  Negative for arthralgias, back pain, joint swelling and myalgias.  Skin:  Negative for rash.  Neurological:  Negative for dizziness, syncope, light-headedness, numbness and headaches.  Hematological:  Negative for adenopathy.  Psychiatric/Behavioral:  Negative for agitation, confusion, sleep disturbance and suicidal ideas. The patient is not nervous/anxious.    BREAST: No symptoms    Objective: BP 127/83   Pulse (!) 55   Ht 5' 4.5" (1.638 m)   Wt 148 lb (67.1 kg)   BMI 25.01 kg/m    Physical Exam Constitutional:      Appearance: She is well-developed.  Genitourinary:     Vulva normal.     Right Labia: No rash, tenderness or lesions.    Left Labia: No tenderness, lesions or rash.       No vaginal discharge, erythema or tenderness.     Mild vaginal atrophy present.     Right Adnexa: not tender and no mass present.    Left Adnexa: not tender and no mass present.    No cervical friability or polyp.     Uterus is not enlarged or tender.  Breasts:    Right: No mass, nipple discharge, skin change or tenderness.     Left: No mass, nipple discharge, skin change or tenderness.  Neck:     Thyroid: No thyromegaly.  Cardiovascular:     Rate and Rhythm: Normal rate and regular rhythm.     Heart sounds: Normal heart sounds. No murmur heard. Pulmonary:      Effort: Pulmonary effort is normal.     Breath sounds: Normal breath sounds.  Abdominal:     Palpations: Abdomen is soft.     Tenderness: There is no abdominal tenderness. There is no guarding or rebound.  Musculoskeletal:        General: Normal range of motion.     Cervical back: Normal range of motion.  Lymphadenopathy:     Cervical: No cervical adenopathy.  Neurological:     General: No focal deficit present.     Mental Status: She is alert and oriented to person, place, and time.     Cranial Nerves: No cranial nerve deficit.  Skin:    General: Skin is warm and dry.  Psychiatric:        Mood and Affect: Mood normal.        Behavior: Behavior normal.        Thought Content: Thought content normal.        Judgment: Judgment normal.  Vitals reviewed.     Assessment/Plan:  Encounter for annual routine gynecological examination  Encounter for screening mammogram for malignant neoplasm of breast  Abnormal mammogram of left breast - Plan: [place] LIMITED ULTRASOUND INCLUDING AXILLA LEFT BREAST , MM 3D DIAGNOSTIC MAMMOGRAM UNILATERAL LEFT BREAST; pt to schedule f/u imaging 5/25.  Vasomotor symptoms due to menopause - Plan: estradiol (VIVELLE-DOT) 0.05 MG/24HR patch, progesterone (PROMETRIUM) 100 MG capsule; mostly improved, Rx RF eRxd. Discussed increasing prog to 200 mg if needed for VS sx, but pt prefers to keep current dosing.  Hormone replacement therapy (HRT) - Plan: estradiol (VIVELLE-DOT) 0.05 MG/24HR patch, progesterone (PROMETRIUM) 100 MG capsule  Dyspareunia in female - Plan: estradiol (ESTRACE VAGINAL) 0.1 MG/GM vaginal cream; add vag ERT, Rx eRxd. Cont coconut oil as lubricant. F/u pn.   Vaginal itching - Plan: estradiol (ESTRACE VAGINAL) 0.1 MG/GM vaginal cream; neg exam, most likely atrophy. Apply vag ERT ext/coconut oil as moisturizer. F/u prn.     Meds ordered this encounter  Medications   estradiol (ESTRACE VAGINAL) 0.1 MG/GM vaginal cream    Sig: Insert 1 g  vaginally  nightly for 1 week, then once wkly as maintenance    Dispense:  42.5 g    Refill:  1    Supervising Provider:   Hildred Laser [[id number]]   estradiol (VIVELLE-DOT) 0.05 MG/24HR patch    Sig: Place 1 patch (0.05 mg total) onto the skin 2 (two) times a week.    Dispense:  24 patch    Refill:  3    Supervising Provider:   Hildred Laser [[id number]]   progesterone (PROMETRIUM) 100 MG capsule    Sig: Take 1 cap nightly, 6 nights on, 1 night off    Dispense:  90 capsule    Refill:  3    Supervising Provider:   Waymon Budge            GYN counsel breast self exam, mammography screening, menopause, adequate intake of calcium and vitamin D, diet and exercise    F/U  Return in about 1 year (around 03/14/2024).  Kirsten Randleman B. Meagan Ancona, PA-C 03/15/2023 9:59 AM

## 2023-03-15 ENCOUNTER — Encounter: Payer: Self-pay | Admitting: Obstetrics and Gynecology

## 2023-03-15 ENCOUNTER — Ambulatory Visit (INDEPENDENT_AMBULATORY_CARE_PROVIDER_SITE_OTHER): Payer: BC Managed Care – PPO | Admitting: Obstetrics and Gynecology

## 2023-03-15 VITALS — BP 127/83 | HR 55 | Ht 64.5 in | Wt 148.0 lb

## 2023-03-15 DIAGNOSIS — N951 Menopausal and female climacteric states: Secondary | ICD-10-CM

## 2023-03-15 DIAGNOSIS — Z01419 Encounter for gynecological examination (general) (routine) without abnormal findings: Secondary | ICD-10-CM | POA: Diagnosis not present

## 2023-03-15 DIAGNOSIS — Z1231 Encounter for screening mammogram for malignant neoplasm of breast: Secondary | ICD-10-CM

## 2023-03-15 DIAGNOSIS — Z79890 Hormone replacement therapy (postmenopausal): Secondary | ICD-10-CM

## 2023-03-15 DIAGNOSIS — N898 Other specified noninflammatory disorders of vagina: Secondary | ICD-10-CM

## 2023-03-15 DIAGNOSIS — R928 Other abnormal and inconclusive findings on diagnostic imaging of breast: Secondary | ICD-10-CM

## 2023-03-15 DIAGNOSIS — N941 Unspecified dyspareunia: Secondary | ICD-10-CM

## 2023-03-15 MED ORDER — ESTRADIOL 0.1 MG/GM VA CREA: TOPICAL_CREAM | VAGINAL | 1 refills | Status: AC

## 2023-03-15 MED ORDER — ESTRADIOL 0.05 MG/24HR TD PTTW
1.0000 | MEDICATED_PATCH | TRANSDERMAL | 3 refills | Status: AC
Start: 2023-03-18 — End: ?

## 2023-03-15 MED ORDER — PROGESTERONE MICRONIZED 100 MG PO CAPS
ORAL_CAPSULE | ORAL | 3 refills | Status: AC
Start: 2023-03-15 — End: ?

## 2023-03-15 NOTE — Patient Instructions (Signed)
I value your feedback and you entrusting us with your care. If you get a [facility] patient survey, I would appreciate you taking the time to let us know about your experience today. Thank you! ? ? ?

## 2023-04-23 DIAGNOSIS — J310 Chronic rhinitis: Secondary | ICD-10-CM | POA: Diagnosis not present

## 2023-05-06 ENCOUNTER — Ambulatory Visit: Payer: BC Managed Care – PPO | Admitting: Podiatry

## 2023-05-15 ENCOUNTER — Ambulatory Visit: Payer: BC Managed Care – PPO | Admitting: Podiatry

## 2023-05-20 ENCOUNTER — Ambulatory Visit (INDEPENDENT_AMBULATORY_CARE_PROVIDER_SITE_OTHER): Payer: BC Managed Care – PPO | Admitting: Podiatry

## 2023-05-20 ENCOUNTER — Ambulatory Visit (INDEPENDENT_AMBULATORY_CARE_PROVIDER_SITE_OTHER): Payer: BC Managed Care – PPO

## 2023-05-20 ENCOUNTER — Encounter: Payer: Self-pay | Admitting: Podiatry

## 2023-05-20 DIAGNOSIS — M7752 Other enthesopathy of left foot: Secondary | ICD-10-CM | POA: Diagnosis not present

## 2023-05-21 NOTE — Progress Notes (Signed)
 Subjective:  Patient ID: Kirsten Patterson, female    DOB: 1970/07/10,  MRN: [id number] HPI Chief Complaint  Patient presents with   Foot Pain    1st MPJ left - bunion surgery with Dr. Excell Seltzer 2 years, cramping in big toe and arch, 3-4 toes feel tight together, also was told to walk on foot 2 weeks prior to what she should have because he got her mixed up with another patient   New Patient (Initial Visit)    53 y.o. female presents with the above complaint.   ROS: Denies fever chills nausea right muscle aches pains calf pain back pain chest pain shortness of breath  Past Medical History:  Diagnosis Date   Asthma    no flare up in years    GERD (gastroesophageal reflux disease)    Headache    migraines/ every few weeks   Hot flashes due to menopause    Orthodontics    permanent retainer - lower front   Palpitations    Wears contact lenses    Past Surgical History:  Procedure Laterality Date   ABLATION     BREAST BIOPSY Left 02/06/2023   Stereo bx, asymmetry, COIL clip-path pending   BREAST BIOPSY Left 02/06/2023   MM LT BREAST BX W LOC DEV 1ST LESION IMAGE BX SPEC STEREO GUIDE 02/06/2023 [facility]   BUNIONECTOMY Left 03/09/2021   Procedure: BUNIONECTOMY - DOUBLE OSTEOTOMY;  Surgeon: Rosetta Posner, DPM;  Location: [facility];  Service: Podiatry;  Laterality: Left;   COLONOSCOPY WITH PROPOFOL N/A 01/29/2020   Procedure: COLONOSCOPY WITH PROPOFOL;  Surgeon: Midge Minium, MD;  Location: [facility];  Service: Endoscopy;  Laterality: N/A;  screening colonoscopy z12.11    HAMMER TOE SURGERY Left 03/09/2021   Procedure: HAMMER TOE CORRECTION - 2ND TOE;  Surgeon: Rosetta Posner, DPM;  Location: [facility];  Service: Podiatry;  Laterality: Left;  Special needs/equipment- Anesthesia- Choice, pre op pop/saph block   SINUS SURGERY WITH INSTATRAK  11/13/2019   x 6    Current Outpatient Medications:    phentermine (ADIPEX-P) 37.5 MG tablet, Take 37.5 mg by  mouth daily., Disp: , Rfl:    venlafaxine XR (EFFEXOR-XR) 75 MG 24 hr capsule, Take 75 mg by mouth daily., Disp: , Rfl:    albuterol (VENTOLIN HFA) 108 (90 Base) MCG/ACT inhaler, Inhale into the lungs every 6 (six) hours as needed for wheezing or shortness of breath., Disp: , Rfl:    cetirizine (ZYRTEC) 10 MG tablet, Take 10 mg by mouth., Disp: , Rfl:    cyclobenzaprine (FLEXERIL) 10 MG tablet, Take 10 mg by mouth at bedtime., Disp: , Rfl:    estradiol (ESTRACE VAGINAL) 0.1 MG/GM vaginal cream, Insert 1 g vaginally nightly for 1 week, then once wkly as maintenance, Disp: 42.5 g, Rfl: 1   estradiol (VIVELLE-DOT) 0.05 MG/24HR patch, Place 1 patch (0.05 mg total) onto the skin 2 (two) times a week., Disp: 24 patch, Rfl: 3   fluticasone (FLONASE) 50 MCG/ACT nasal spray, Place into both nostrils daily., Disp: , Rfl:    omeprazole (PRILOSEC) 40 MG capsule, Take by mouth., Disp: , Rfl:    progesterone (PROMETRIUM) 100 MG capsule, Take 1 cap nightly, 6 nights on, 1 night off, Disp: 90 capsule, Rfl: 3   promethazine (PHENERGAN) 25 MG tablet, Take by mouth., Disp: , Rfl:    propranolol ER (INDERAL LA) 60 MG 24 hr capsule, Take 60 mg by mouth daily., Disp: , Rfl:    valACYclovir (VALTREX) 1000  MG tablet, Take by mouth., Disp: , Rfl:   No Known Allergies Review of Systems Objective:  There were no vitals filed for this visit.  General: Well developed, nourished, in no acute distress, alert and oriented x3   Dermatological: Skin is warm, dry and supple bilateral. Nails x 10 are well maintained; remaining integument appears unremarkable at this time. There are no open sores, no preulcerative lesions, no rash or signs of infection present.  Vascular: Dorsalis Pedis artery and Posterior Tibial artery pedal pulses are 2/4 bilateral with immedate capillary fill time. Pedal hair growth present. No varicosities and no lower extremity edema present bilateral.   Neruologic: Grossly intact via light touch  bilateral. Vibratory intact via tuning fork bilateral. Protective threshold with Semmes Wienstein monofilament intact to all pedal sites bilateral. Patellar and Achilles deep tendon reflexes 2+ bilateral. No Babinski or clonus noted bilateral.   Musculoskeletal: All joints distal to ankle full range of motion without crepitation.  Left foot does demonstrate signs of surgery have previously particular with the first metatarsal phalangeal joint.  She has good range of motion of the first metatarsal phalangeal joint with mild crepitation and pain on range of motion.  Mild hallux valgus is still present. She has pain on palpation of the second metatarsal phalangeal joint and third metatarsophalangeal joints.  Gait: Unassisted, Nonantalgic.    Radiographs:  Radiographs taken today demonstrate an osseously mature individual with good bone mineralization.  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  She has had a capital osteotomy with screw fixation first metatarsal with what appears to be an inadequate reduction.  She is also had an Aiken osteotomy with staple fixation.  Both of these have gone on to heal nicely.  She does have joint space narrowing particular on the lateral aspect of the first metatarsophalangeal joint with some spurring noted.  On lateral view does demonstrate that she has a mild elevatus of the first metatarsal with what appears to be some cystic changes to the head of the bone.  Second metatarsal is apparently long and the third metatarsal phalangeal joint demonstrates early dislocation.  Palpable internal fixation head of the first metatarsal.  Assessment & Plan:   Assessment: Residual pain secondary to the bunion repair and a metatarsus adductus.  Capsulitis of the first metatarsophalangeal joint second and third as well.  Osteoarthritic changes first metatarsal phalangeal joint.  Plan: Discussed etiology pathology conservative surgical therapies at this point have set this  patient up for evaluation by Dr. Lilian Kapur for possible metatarsus adductus correction and a possible fusion of the first metatarsophalangeal joint.  Most likely a [place] plasty will be necessary as well.  She will follow-up with him next Wednesday.     Kashton Mcartor T. [place], [place]

## 2023-05-27 ENCOUNTER — Ambulatory Visit (INDEPENDENT_AMBULATORY_CARE_PROVIDER_SITE_OTHER): Payer: BC Managed Care – PPO | Admitting: Podiatry

## 2023-05-27 ENCOUNTER — Encounter: Payer: Self-pay | Admitting: Podiatry

## 2023-05-27 DIAGNOSIS — M7742 Metatarsalgia, left foot: Secondary | ICD-10-CM | POA: Diagnosis not present

## 2023-05-27 DIAGNOSIS — Q66222 Congenital metatarsus adductus, left foot: Secondary | ICD-10-CM | POA: Diagnosis not present

## 2023-05-27 DIAGNOSIS — M19072 Primary osteoarthritis, left ankle and foot: Secondary | ICD-10-CM | POA: Diagnosis not present

## 2023-05-27 NOTE — Patient Instructions (Signed)
Call [facility] to schedule your CT at the below locations.  Please allow at least 1 business day after your visit to process the referral.  It may take longer depending on approval from insurance.  Please let me know if you have issues or problems scheduling the CT   [facility] [phone] [address] [place],  [address]  [facility] [place] [phone] [address] [place],  [address]  

## 2023-05-28 ENCOUNTER — Encounter: Payer: Self-pay | Admitting: Podiatry

## 2023-05-28 NOTE — Progress Notes (Signed)
  Subjective:  Patient ID: Kirsten Patterson, female    DOB: 22-Jul-1970,  MRN: [id number]  Chief Complaint  Patient presents with   Foot Pain    "Dr. Al Corpus wanted me to come and see him and see if I need surgery or not."    Discussed the use of AI scribe software for clinical note transcription with the patient, who gave verbal consent to proceed.  History of Present Illness   Kirsten Patterson is a 53 year old female with metatarsus adductus and arthritis who presents with foot pain and post-surgical complications. She was referred by Dr. Al Corpus for evaluation of her foot pain and post-surgical complications.  She underwent foot surgery approximately three to four years ago, around 2021, and is experiencing pain primarily along the top of her foot, under the foot, and across the top. The pain is described as throbbing and sometimes severe, with occasional shooting sensations and severe cramps underneath the foot. Pain is sometimes alleviated by medication, which she takes only when absolutely necessary. The pain is localized to the area where a screw head is present, and she experiences pain when pressure is applied to specific areas of the foot. No radiation of pain when pressure is applied to certain areas, but there is some pain when pressure is applied to others.  Her family history includes severe osteoporosis in her grandmother. She has previously undergone a DEXA scan due to long-term oral steroid use for sinus issues, which was normal at the time. She does not currently take any medications or have any other medical issues.          Objective:    Physical Exam   MUSCULOSKELETAL: Pain in the dorsal first MTP and first, second, and third TMT dorsally. Pain with range of motion and crepitus in the first TMT and first MTP. VASCULAR: Pulses normal, good capillary fill time       No images are attached to the encounter.    Results   RADIOLOGY Foot X-ray: Previous distal first metatarsal  osteotomy with underlying metatarsus adductus, degenerative changes noted in the TMTJs are fairly mild and moderate to severe in the first MTP. (05/20/2023)      Assessment:   1. Osteoarthritis of first metatarsophalangeal (MTP) joint of left foot   2. Metatarsus adductus of left foot   3. Metatarsalgia of left foot   4. Arthritis of midtarsal joint of left foot      Plan:  Patient was evaluated and treated and all questions answered.  Assessment and Plan    Postoperative Bunionectomy with Metatarsus Adductus Persistent pain in the foot, particularly in the area of the first metatarsophalangeal joint (MTP) and the first, second, and third tarsometatarsal joints (TMT). X-rays show underlying metatarsus adductus, mild degenerative changes in the TMT joints, and moderate to severe changes in the first MTP joint. Possible nerve irritation due to screw placement and scar tissue formation. -Order a CT scan to better visualize joint surfaces and assess the extent of arthritis and possible nerve irritation for surgical planning. -Discuss potential for multiple joint fusions (1st MTP, 1/2/3 TMT with corrective wedge osteotomy) to correct alignment and relieve pain, pending CT scan results. -Check vitamin D and calcium levels closer to potential surgery date to ensure optimal bone health.          Return for after CT to review.

## 2023-05-31 ENCOUNTER — Ambulatory Visit
Admission: RE | Admit: 2023-05-31 | Discharge: 2023-05-31 | Disposition: A | Discharge status: Home / Self Care | Source: Ambulatory Visit | Attending: Podiatry | Admitting: Podiatry

## 2023-05-31 DIAGNOSIS — M19072 Primary osteoarthritis, left ankle and foot: Secondary | ICD-10-CM

## 2023-05-31 DIAGNOSIS — M79672 Pain in left foot: Secondary | ICD-10-CM | POA: Diagnosis not present

## 2023-05-31 DIAGNOSIS — Q66222 Congenital metatarsus adductus, left foot: Secondary | ICD-10-CM

## 2023-05-31 DIAGNOSIS — M7742 Metatarsalgia, left foot: Secondary | ICD-10-CM

## 2023-05-31 DIAGNOSIS — G8929 Other chronic pain: Secondary | ICD-10-CM | POA: Diagnosis not present

## 2023-06-17 ENCOUNTER — Encounter: Payer: Self-pay | Admitting: Podiatry

## 2023-07-03 DIAGNOSIS — R399 Unspecified symptoms and signs involving the genitourinary system: Secondary | ICD-10-CM | POA: Diagnosis not present

## 2023-07-03 DIAGNOSIS — R8290 Unspecified abnormal findings in urine: Secondary | ICD-10-CM | POA: Diagnosis not present

## 2023-08-02 ENCOUNTER — Encounter (HOSPITAL_COMMUNITY): Payer: Self-pay

## 2023-08-09 ENCOUNTER — Ambulatory Visit
Admission: RE | Admit: 2023-08-09 | Discharge: 2023-08-09 | Disposition: A | Discharge status: Home / Self Care | Payer: BC Managed Care – PPO | Source: Ambulatory Visit | Attending: Obstetrics and Gynecology | Admitting: Obstetrics and Gynecology

## 2023-08-09 DIAGNOSIS — R928 Other abnormal and inconclusive findings on diagnostic imaging of breast: Secondary | ICD-10-CM | POA: Diagnosis not present

## 2023-08-09 DIAGNOSIS — N6324 Unspecified lump in the left breast, lower inner quadrant: Secondary | ICD-10-CM | POA: Diagnosis not present

## 2023-08-09 DIAGNOSIS — R92333 Mammographic heterogeneous density, bilateral breasts: Secondary | ICD-10-CM | POA: Diagnosis not present

## 2023-08-11 ENCOUNTER — Ambulatory Visit: Payer: Self-pay | Admitting: Obstetrics and Gynecology

## 2023-09-05 DIAGNOSIS — R399 Unspecified symptoms and signs involving the genitourinary system: Secondary | ICD-10-CM | POA: Diagnosis not present

## 2023-09-18 ENCOUNTER — Ambulatory Visit (INDEPENDENT_AMBULATORY_CARE_PROVIDER_SITE_OTHER): Admitting: Podiatry

## 2023-09-18 DIAGNOSIS — E559 Vitamin D deficiency, unspecified: Secondary | ICD-10-CM | POA: Diagnosis not present

## 2023-09-18 DIAGNOSIS — Q66222 Congenital metatarsus adductus, left foot: Secondary | ICD-10-CM | POA: Diagnosis not present

## 2023-09-18 DIAGNOSIS — M19072 Primary osteoarthritis, left ankle and foot: Secondary | ICD-10-CM

## 2023-09-20 NOTE — Progress Notes (Signed)
 Subjective:  Patient ID: Kirsten Patterson, female    DOB: [birth year],  MRN: [id number]  Chief Complaint  Patient presents with   Arthritis    Surgery planning visit     History of Present Illness   She returns for follow-up ready to schedule surgery.  She completed the CT scan after the last visit.  Symptoms are about the same quite painful limiting and impacting her function and activities of daily living.         Objective:    Physical Exam   MUSCULOSKELETAL: Pain in the dorsal first MTP and first, second, and third TMT dorsally. Pain with range of motion and crepitus in the first TMT and first MTP. VASCULAR: Pulses normal, good capillary fill time       No images are attached to the encounter.    Results   RADIOLOGY Foot X-ray: Previous distal first metatarsal osteotomy with underlying metatarsus adductus, degenerative changes noted in the TMTJs are fairly mild and moderate to severe in the first MTP. (05/20/2023)   Study Result  Narrative & Impression  CLINICAL DATA:  Chronic foot pain.   EXAM: CT OF THE LEFT FOOT WITHOUT CONTRAST   TECHNIQUE: Multidetector CT imaging of the left foot was performed according to the standard protocol. Multiplanar CT image reconstructions were also generated.   RADIATION DOSE REDUCTION: This exam was performed according to the departmental dose-optimization program which includes automated exposure control, adjustment of the mA and/or kV according to patient size and/or use of iterative reconstruction technique.   COMPARISON:  None Available.   FINDINGS: Bones/Joint/Cartilage   No acute fracture or dislocation. Old ununited fracture of the plantar lateral base of the first metatarsal versus an accessory ossicle.   Healed first metatarsal neck osteotomy and base of the first proximal phalanx without hardware failure or complication. Severe osteoarthritis of the first MTP joint. Mild arthritic changes of the medial and  lateral hallux sesamoid-metatarsal articulation.   Mild osteoarthritis of the first TMT joint. Mild osteoarthritis of the second TMT joint. No joint effusion.   Ligaments   Ligaments are suboptimally evaluated by CT.   Muscles and Tendons Muscles are normal. No muscle atrophy. No intramuscular fluid collection or hematoma. Flexor, extensor, peroneal and Achilles tendons are intact.   Soft tissue No fluid collection or hematoma.  No soft tissue mass.   IMPRESSION: 1. No acute osseous injury of the left foot. 2. Healed first metatarsal neck osteotomy and base of the first proximal phalanx without hardware failure or complication. Severe osteoarthritis of the first MTP joint. 3. Mild osteoarthritis of the first and second TMT joint.     Electronically Signed   By: Julaine Blanch M.D.   On: 06/10/2023 13:37       Assessment:   Encounter Diagnoses  Name Primary?   Vitamin D  deficiency    Osteoarthritis of first metatarsophalangeal (MTP) joint of left foot Yes   Metatarsus adductus of left foot    Arthritis of midtarsal joint of left foot      Plan:  Patient was evaluated and treated and all questions answered.  Assessment and Plan    Postoperative Bunionectomy with Metatarsus Adductus We again reviewed her radiographs and her CT results.  The CT showed significant osteoarthritic changes in the midfoot TMT's as well as the first MTP.  Her significant metatarsus adductus deformity is also contributing to her symptoms and under correction.  At her age and at this point I do not recommend nonoperative treatment  as she likely will have continued progression of her symptoms and worsening arthritis and adjacent joint arthrosis as well.  Surgically we discussed arthrodesis of the affected joints including the first second third tarsometatarsal joints and the first MTP joint.  Discussed the use of bone marrow aspirate and cadaveric bone graft.  We discussed the risk benefits and  potential complications including but not limited to pain, swelling, infection, scar, numbness which may be temporary or permanent, chronic pain, stiffness, nerve pain or damage, wound healing problems, bone healing problems including delayed or non-union.  We also discussed the recovery time for a period of nonweightbearing with a splint and boot for about 1 month with a knee scooter.  She has this from prior surgery and we will utilize this she will me know if there are any other DME needs for her prior to surgery.  Vitamin D  and calcium level ordered.  I will notify her of the results and supplement as needed.    Surgical plan:  Procedure: - Left foot midfoot fusion with corrective osteotomies to correct metatarsus adductus and first MTP fusion, bone marrow aspiration from leg and cadaveric bone graft use  Location: - [facility]  Anesthesia plan: - General With regional block  Postoperative pain plan: - Tylenol  1000 mg every 6 hours, ibuprofen 600 mg every 6 hours, gabapentin 300 mg every 8 hours x5 days, oxycodone  5 mg 1-2 tabs every 6 hours only as needed  DVT prophylaxis: - Aspirin  325 mg twice daily  WB Restrictions / DME needs: - Nonweightbearing in splint postop

## 2023-10-02 ENCOUNTER — Telehealth: Payer: Self-pay | Admitting: Podiatry

## 2023-10-02 NOTE — Telephone Encounter (Signed)
 Received surgical consent forms.  Left message for pt to call me back to schedule surgery.

## 2023-10-07 NOTE — Telephone Encounter (Signed)
 Pt called back and left message on 7/11 stating she was calling to discuss surgery.  She called back today as she was out of town and she is scheduled for surgery on 9/26 and is not on any blood thinners or glp 1 medications and pharmacy correct in chart.

## 2023-10-08 DIAGNOSIS — E559 Vitamin D deficiency, unspecified: Secondary | ICD-10-CM | POA: Diagnosis not present

## 2023-10-10 LAB — VITAMIN D 25 HYDROXY (VIT D DEFICIENCY, FRACTURES): Vit D, 25-Hydroxy: 46.2 ng/mL (ref 30.0–100.0)

## 2023-10-10 LAB — CALCIUM: Calcium: 9.5 mg/dL (ref 8.7–10.2)

## 2023-10-21 ENCOUNTER — Ambulatory Visit: Payer: Self-pay | Admitting: Podiatry

## 2023-10-31 DIAGNOSIS — R399 Unspecified symptoms and signs involving the genitourinary system: Secondary | ICD-10-CM | POA: Diagnosis not present

## 2023-11-27 ENCOUNTER — Other Ambulatory Visit: Payer: Self-pay | Admitting: Internal Medicine

## 2023-11-27 DIAGNOSIS — Z1231 Encounter for screening mammogram for malignant neoplasm of breast: Secondary | ICD-10-CM

## 2023-11-27 DIAGNOSIS — Z0000 Encounter for general adult medical examination without abnormal findings: Secondary | ICD-10-CM

## 2023-11-27 DIAGNOSIS — E782 Mixed hyperlipidemia: Secondary | ICD-10-CM

## 2023-11-27 DIAGNOSIS — R002 Palpitations: Secondary | ICD-10-CM

## 2023-12-02 ENCOUNTER — Telehealth: Payer: Self-pay | Admitting: Podiatry

## 2023-12-02 NOTE — Telephone Encounter (Signed)
 DOS- 12/20/2023  HALLUX MPJ FUSION LT- 71249 ARTHRODESIS, MID TARSAL WITH OSTEOTOMY LT- 28735  [employer] EFFECTIVE DATE- 03/27/2023  DEDUCTIBLE- $2500 REMAINING- $0 OOP- $5000 REMAINING- $2418.93 COINSURANCE- 20%  PER FAX RECEIVED FROM MERILYNN BARROWS FOR CPT CODE 71249 HAS BEEN APPROVED FROM 12/20/2023-02/17/2024. AUTH# [id number] PER [employer] WEBSITE, NO PRIOR AUTH IS REQUIRED FOR CPT CODE 71264. DOCUMENTATION ATTACHED TO SURGERY CONSENT PACKET.

## 2023-12-02 NOTE — Telephone Encounter (Signed)
 error

## 2023-12-05 ENCOUNTER — Ambulatory Visit
Admission: RE | Admit: 2023-12-05 | Discharge: 2023-12-05 | Disposition: A | Discharge status: Home / Self Care | Payer: Self-pay | Source: Ambulatory Visit | Attending: Internal Medicine | Admitting: Internal Medicine

## 2023-12-05 DIAGNOSIS — Z0000 Encounter for general adult medical examination without abnormal findings: Secondary | ICD-10-CM | POA: Insufficient documentation

## 2023-12-05 DIAGNOSIS — E782 Mixed hyperlipidemia: Secondary | ICD-10-CM | POA: Insufficient documentation

## 2023-12-05 DIAGNOSIS — R002 Palpitations: Secondary | ICD-10-CM | POA: Insufficient documentation

## 2023-12-11 ENCOUNTER — Encounter: Payer: Self-pay | Admitting: Podiatry

## 2023-12-12 ENCOUNTER — Encounter: Payer: Self-pay | Admitting: Podiatry

## 2023-12-12 ENCOUNTER — Telehealth: Payer: Self-pay | Admitting: Podiatry

## 2023-12-12 NOTE — Telephone Encounter (Signed)
 cld pt and adv would correct note to be excused from [employer]. I vrf her email addr and emailed the corrected note to her see prev notes

## 2023-12-20 ENCOUNTER — Other Ambulatory Visit: Payer: Self-pay | Admitting: Podiatry

## 2023-12-20 DIAGNOSIS — Q66222 Congenital metatarsus adductus, left foot: Secondary | ICD-10-CM | POA: Diagnosis not present

## 2023-12-20 DIAGNOSIS — M7742 Metatarsalgia, left foot: Secondary | ICD-10-CM | POA: Diagnosis not present

## 2023-12-20 DIAGNOSIS — M19072 Primary osteoarthritis, left ankle and foot: Secondary | ICD-10-CM | POA: Diagnosis not present

## 2023-12-20 MED ORDER — IBUPROFEN 600 MG PO TABS: 600.0000 mg | ORAL_TABLET | Freq: Four times a day (QID) | ORAL | 0 refills | Status: AC | PRN

## 2023-12-20 MED ORDER — ACETAMINOPHEN 500 MG PO TABS: 1000.0000 mg | ORAL_TABLET | Freq: Four times a day (QID) | ORAL | 0 refills | Status: AC | PRN

## 2023-12-20 MED ORDER — GABAPENTIN 300 MG PO CAPS: 300.0000 mg | ORAL_CAPSULE | Freq: Three times a day (TID) | ORAL | 0 refills | Status: AC

## 2023-12-20 MED ORDER — OXYCODONE HCL 5 MG PO TABS: 5.0000 mg | ORAL_TABLET | Freq: Four times a day (QID) | ORAL | 0 refills | Status: AC | PRN

## 2023-12-25 ENCOUNTER — Encounter: Admitting: Podiatry

## 2023-12-25 ENCOUNTER — Ambulatory Visit (INDEPENDENT_AMBULATORY_CARE_PROVIDER_SITE_OTHER): Payer: Self-pay | Admitting: Podiatry

## 2023-12-25 ENCOUNTER — Ambulatory Visit (INDEPENDENT_AMBULATORY_CARE_PROVIDER_SITE_OTHER)

## 2023-12-25 VITALS — Ht 64.5 in | Wt 148.0 lb

## 2023-12-25 DIAGNOSIS — M19072 Primary osteoarthritis, left ankle and foot: Secondary | ICD-10-CM

## 2023-12-25 DIAGNOSIS — Q66222 Congenital metatarsus adductus, left foot: Secondary | ICD-10-CM

## 2023-12-29 ENCOUNTER — Encounter: Payer: Self-pay | Admitting: Podiatry

## 2023-12-29 NOTE — Progress Notes (Signed)
  Subjective:  Patient ID: Kirsten Patterson, female    DOB: Jun 15, 1970,  MRN: [id number]  Chief Complaint  Patient presents with   Post-op Follow-up    RM 1 POV # 1 DOS 12/20/23 LT MID FOOT FUSION, BUNION CORRECTION AND GREAT TOE FUSION W/ BONE GRAFTING ( BONE FROM CADIVER AND BONE MARROW FROM LEG) Kirsten Patterson PT      53 y.o. female returns for post-op check.    Review of Systems: Negative except as noted in the HPI. Denies N/V/F/Ch.   Objective:  There were no vitals filed for this visit. Body mass index is 25.01 kg/m. Constitutional Well developed. Well nourished.  Vascular Foot warm and well perfused. Capillary refill normal to all digits.  Calf is soft and supple, no posterior calf or knee pain, negative Homans' sign  Neurologic Normal speech. Oriented to person, place, and time. Epicritic sensation to light touch grossly present bilaterally.  Dermatologic Skin healing well without signs of infection. Skin edges well coapted without signs of infection.  Orthopedic: Tenderness to palpation noted about the surgical site.   Multiple view plain film radiographs: Arthrodesis of midfoot and first MTP noted without complication Assessment:   1. Osteoarthritis of first metatarsophalangeal (MTP) joint of left foot   2. Metatarsus adductus of left foot    Plan:  Patient was evaluated and treated and all questions answered.  S/p foot surgery left -Progressing as expected post-operatively. -XR: Noted above -WB Status: Nonweightbearing in CAM boot -Sutures: Return in 2 weeks to remove. -Medications: No refills required -Foot redressed.  No follow-ups on file.

## 2024-01-08 ENCOUNTER — Ambulatory Visit (INDEPENDENT_AMBULATORY_CARE_PROVIDER_SITE_OTHER): Admitting: Podiatry

## 2024-01-08 VITALS — Ht 64.5 in | Wt 148.0 lb

## 2024-01-08 DIAGNOSIS — Q66222 Congenital metatarsus adductus, left foot: Secondary | ICD-10-CM

## 2024-01-08 DIAGNOSIS — M19072 Primary osteoarthritis, left ankle and foot: Secondary | ICD-10-CM

## 2024-01-09 NOTE — Progress Notes (Signed)
  Subjective:  Patient ID: Kirsten Patterson, female    DOB: 1970/08/29,  MRN: [id number]  Chief Complaint  Patient presents with   Post-op Follow-up    POV # 2 DOS 12/20/23 LT MID FOOT FUSION, BUNION CORRECTION AND GREAT TOE FUSION W/ BONE GRAFTING ( BONE FROM CADIVER AND BONE MARROW FROM LEG. Surgical site is healing well, all sutures are intact.      53 y.o. female returns for post-op check.    Review of Systems: Negative except as noted in the HPI. Denies N/V/F/Ch.   Objective:  There were no vitals filed for this visit. Body mass index is 25.01 kg/m. Constitutional Well developed. Well nourished.  Vascular Foot warm and well perfused. Capillary refill normal to all digits.  Calf is soft and supple, no posterior calf or knee pain, negative Homans' sign  Neurologic Normal speech. Oriented to person, place, and time. Epicritic sensation to light touch grossly present bilaterally.  Dermatologic Skin healing well without signs of infection. Skin edges well coapted without signs of infection.  Orthopedic: Tenderness to palpation noted about the surgical site.   Multiple view plain film radiographs: Arthrodesis of midfoot and first MTP noted without complication Assessment:   1. Osteoarthritis of first metatarsophalangeal (MTP) joint of left foot   2. Metatarsus adductus of left foot    Plan:  Patient was evaluated and treated and all questions answered.  S/p foot surgery left - Sutures removed.  May shower and bathe.  Continue range of motion of the lesser toes.  Compression sleeve dispensed.  Return in 3 weeks for new x-rays and may begin gradual partial weightbearing after this.  No follow-ups on file.

## 2024-01-29 ENCOUNTER — Ambulatory Visit (INDEPENDENT_AMBULATORY_CARE_PROVIDER_SITE_OTHER): Admitting: Podiatry

## 2024-01-29 ENCOUNTER — Ambulatory Visit (INDEPENDENT_AMBULATORY_CARE_PROVIDER_SITE_OTHER)

## 2024-01-29 VITALS — Ht 64.5 in | Wt 148.0 lb

## 2024-01-29 DIAGNOSIS — M21612 Bunion of left foot: Secondary | ICD-10-CM

## 2024-01-29 DIAGNOSIS — Q66222 Congenital metatarsus adductus, left foot: Secondary | ICD-10-CM

## 2024-01-29 DIAGNOSIS — M19072 Primary osteoarthritis, left ankle and foot: Secondary | ICD-10-CM

## 2024-01-30 NOTE — Progress Notes (Signed)
  Subjective:  Patient ID: Kirsten Patterson, female    DOB: December 30, 1970,  MRN: [id number]  Chief Complaint  Patient presents with   Post-op Follow-up    POV # 3 DOS 12/20/23 LT MID FOOT FUSION, BUNION CORRECTION AND GREAT TOE FUSION W/ BONE GRAFTING ( BONE FROM CADIVER AND BONE MARROW FROM LEG) Kirsten Patterson PT. Pt states some minor pain at surgical site.       53 y.o. female returns for post-op check.    Review of Systems: Negative except as noted in the HPI. Denies N/V/F/Ch.   Objective:  There were no vitals filed for this visit. Body mass index is 25.01 kg/m. Constitutional Well developed. Well nourished.  Vascular Foot warm and well perfused. Capillary refill normal to all digits.  Calf is soft and supple, no posterior calf or knee pain, negative Homans' sign  Neurologic Normal speech. Oriented to person, place, and time. Epicritic sensation to light touch grossly present bilaterally.  Derm  Incision is well-healed and not hypertrophic  Orthopedic: Mild tenderness to palpation.  Edema improving   Multiple view plain film radiographs: Good early consolidation across fusion sites noted less so in the 2nd and 3rd Assessment:   1. Bunion of left foot   2. Osteoarthritis of first metatarsophalangeal (MTP) joint of left foot   3. Metatarsus adductus of left foot    Plan:  Patient was evaluated and treated and all questions answered.  S/p foot surgery left - Doing well showing some increasing consolidation, can begin gradual transition to weightbearing in boot.  In the 2 to 4-week range of comfortable walking in the boot she can start transition to a supportive shoe for limited distance.  Return in 6 weeks with new x-rays.  Return in about 6 weeks (around 03/11/2024) for post op (new x-rays).

## 2024-02-24 ENCOUNTER — Other Ambulatory Visit: Payer: Self-pay | Admitting: Medical Genetics

## 2024-02-26 ENCOUNTER — Ambulatory Visit
Admission: RE | Admit: 2024-02-26 | Discharge: 2024-02-26 | Disposition: A | Source: Ambulatory Visit | Attending: Internal Medicine | Admitting: Internal Medicine

## 2024-02-26 DIAGNOSIS — Z1231 Encounter for screening mammogram for malignant neoplasm of breast: Secondary | ICD-10-CM | POA: Insufficient documentation

## 2024-02-27 ENCOUNTER — Inpatient Hospital Stay
Admission: RE | Admit: 2024-02-27 | Discharge: 2024-02-27 | Payer: Self-pay | Attending: Medical Genetics | Admitting: Medical Genetics

## 2024-03-09 LAB — GENECONNECT MOLECULAR SCREEN: Genetic Analysis Overall Interpretation: NEGATIVE

## 2024-03-11 ENCOUNTER — Ambulatory Visit

## 2024-03-11 ENCOUNTER — Ambulatory Visit: Admitting: Podiatry

## 2024-03-11 VITALS — Ht 64.5 in | Wt 148.0 lb

## 2024-03-11 DIAGNOSIS — M21612 Bunion of left foot: Secondary | ICD-10-CM | POA: Diagnosis not present

## 2024-03-11 DIAGNOSIS — M19072 Primary osteoarthritis, left ankle and foot: Secondary | ICD-10-CM

## 2024-03-11 DIAGNOSIS — Q66222 Congenital metatarsus adductus, left foot: Secondary | ICD-10-CM

## 2024-03-11 DIAGNOSIS — E559 Vitamin D deficiency, unspecified: Secondary | ICD-10-CM

## 2024-03-15 NOTE — Progress Notes (Signed)
"  °  Subjective:  Patient ID: Kirsten Patterson, female    DOB: 1970-11-23,  MRN: [id number]  Chief Complaint  Patient presents with   Post-op Follow-up    Rm 1 Patient is here for post-op left foot. Pt states some continued pain by the 1st metatarsal when walking.      53 y.o. female returns for post-op check.  Overall doing okay some continued pain but is able to walk some distance in regular shoes.  Review of Systems: Negative except as noted in the HPI. Denies N/V/F/Ch.   Objective:  There were no vitals filed for this visit. Body mass index is 25.01 kg/m. Constitutional Well developed. Well nourished.  Vascular Foot warm and well perfused. Capillary refill normal to all digits.  Calf is soft and supple, no posterior calf or knee pain, negative Homans' sign  Neurologic Normal speech. Oriented to person, place, and time. Epicritic sensation to light touch grossly present bilaterally.  Derm  Incision is well-healed and not hypertrophic  Orthopedic: Mild tenderness to palpation.  Edema improving   Multiple view plain film radiographs: There is good consolidation across the first MTP fusion site, good consolidation across the third TMT and less than the second and a portion of the medial first Assessment:   1. Bunion of left foot   2. Osteoarthritis of first metatarsophalangeal (MTP) joint of left foot   3. Metatarsus adductus of left foot   4. Vitamin D  deficiency    Plan:  Patient was evaluated and treated and all questions answered.  S/p foot surgery left - Having some delayed healing in the 1st and 2nd TMT.  Continue stiff supportive shoe gear only.  Return in 6 weeks for new radiographs.  I did recommend we recheck her vitamin D  levels and order was placed for this as well.  Return in about 6 weeks (around 04/22/2024) for new left foot xrays sx f/u .  "

## 2024-03-17 LAB — VITAMIN D 25 HYDROXY (VIT D DEFICIENCY, FRACTURES): Vit D, 25-Hydroxy: 35.1 ng/mL (ref 30.0–100.0)

## 2024-03-23 ENCOUNTER — Ambulatory Visit: Payer: Self-pay | Admitting: Podiatry

## 2024-04-15 ENCOUNTER — Ambulatory Visit

## 2024-04-15 ENCOUNTER — Ambulatory Visit (INDEPENDENT_AMBULATORY_CARE_PROVIDER_SITE_OTHER): Admitting: Podiatry

## 2024-04-15 VITALS — Ht 64.5 in | Wt 148.0 lb

## 2024-04-15 DIAGNOSIS — M19072 Primary osteoarthritis, left ankle and foot: Secondary | ICD-10-CM

## 2024-04-15 DIAGNOSIS — M21612 Bunion of left foot: Secondary | ICD-10-CM

## 2024-04-15 NOTE — Progress Notes (Signed)
"  °  Subjective:  Patient ID: Kirsten Patterson, female    DOB: 1970/11/27,  MRN: [id number]  Chief Complaint  Patient presents with   Post-op Follow-up    RM 3 Patient is here to f/u on left foot bunion surgery. Pt states minor pain (2/10) in left foot when walking and left 3rd toe feels numb to the touch.      54 y.o. female returns for post-op check.  Overall doing okay still utilizing the boot  Review of Systems: Negative except as noted in the HPI. Denies N/V/F/Ch.   Objective:  There were no vitals filed for this visit. Body mass index is 25.01 kg/m. Constitutional Well developed. Well nourished.  Vascular Foot warm and well perfused. Capillary refill normal to all digits.  Calf is soft and supple, no posterior calf or knee pain, negative Homans' sign  Neurologic Normal speech. Oriented to person, place, and time. Epicritic sensation to light touch grossly present bilaterally.  Derm  Incision is well-healed and not hypertrophic  Orthopedic: Mild tenderness to palpation.  Edema largely resolved   Multiple view plain film radiographs: Increased consolidation across the first 2nd and 3rd TMT fusion sites  Vitamin D  level was normal Assessment:   1. Bunion of left foot   2. Osteoarthritis of first metatarsophalangeal (MTP) joint of left foot    Plan:  Patient was evaluated and treated and all questions answered.  S/p foot surgery left - Doing better can begin to transition if tolerated out of the cam walker boot.  May have some stress reaction and the lesser metatarsal due to reconstruction.  Expect this should resolve with time.  Discussed if worsening to continue to utilize CAM boot as needed.  Follow-up in 2 months for 63-month postop visit.  Sent for  Return in about 2 months (around 06/13/2024) for 6 mo surgery follow up (new left foot XR).  "

## 2024-04-22 ENCOUNTER — Encounter: Admitting: Podiatry

## 2024-06-03 ENCOUNTER — Ambulatory Visit: Admitting: Podiatry
